# Patient Record
Sex: Female | Born: 1995 | Race: Black or African American | Hispanic: No | Marital: Single | Smoking: Current every day smoker
Health system: Southern US, Community
[De-identification: ages and names within clinical notes are randomized; demographics above are authoritative.]

## PROBLEM LIST (undated history)

## (undated) DIAGNOSIS — F329 Major depressive disorder, single episode, unspecified: Secondary | ICD-10-CM

## (undated) DIAGNOSIS — J45909 Unspecified asthma, uncomplicated: Secondary | ICD-10-CM

## (undated) DIAGNOSIS — F909 Attention-deficit hyperactivity disorder, unspecified type: Secondary | ICD-10-CM

## (undated) DIAGNOSIS — F319 Bipolar disorder, unspecified: Secondary | ICD-10-CM

## (undated) DIAGNOSIS — F419 Anxiety disorder, unspecified: Secondary | ICD-10-CM

## (undated) DIAGNOSIS — F32A Depression, unspecified: Secondary | ICD-10-CM

## (undated) DIAGNOSIS — N611 Abscess of the breast and nipple: Secondary | ICD-10-CM

## (undated) DIAGNOSIS — T50901A Poisoning by unspecified drugs, medicaments and biological substances, accidental (unintentional), initial encounter: Secondary | ICD-10-CM

## (undated) HISTORY — DX: Abscess of the breast and nipple: N61.1

## (undated) HISTORY — DX: Anxiety disorder, unspecified: F41.9

## (undated) HISTORY — DX: Attention-deficit hyperactivity disorder, unspecified type: F90.9

## (undated) HISTORY — PX: INCISION AND DRAINAGE BREAST ABSCESS: SUR672

---
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
## 2014-07-21 ENCOUNTER — Emergency Department: Payer: Self-pay | Admitting: Emergency Medicine

## 2015-01-19 ENCOUNTER — Emergency Department
Admission: EM | Admit: 2015-01-19 | Discharge: 2015-01-19 | Discharge status: Against Medical Advice | Payer: BLUE CROSS/BLUE SHIELD | Attending: Emergency Medicine | Admitting: Emergency Medicine

## 2015-01-19 ENCOUNTER — Other Ambulatory Visit: Payer: Self-pay

## 2015-01-19 DIAGNOSIS — R109 Unspecified abdominal pain: Secondary | ICD-10-CM | POA: Diagnosis not present

## 2015-01-19 DIAGNOSIS — R079 Chest pain, unspecified: Secondary | ICD-10-CM | POA: Insufficient documentation

## 2015-01-19 DIAGNOSIS — R51 Headache: Secondary | ICD-10-CM | POA: Insufficient documentation

## 2015-01-19 LAB — CBC
HCT: 40.1 % (ref 35.0–47.0)
Hemoglobin: 14 g/dL (ref 12.0–16.0)
MCH: 34.9 pg — AB (ref 26.0–34.0)
MCHC: 34.9 g/dL (ref 32.0–36.0)
MCV: 100.2 fL — AB (ref 80.0–100.0)
PLATELETS: 249 10*3/uL (ref 150–440)
RBC: 4 MIL/uL (ref 3.80–5.20)
RDW: 14.7 % — AB (ref 11.5–14.5)
WBC: 10 10*3/uL (ref 3.6–11.0)

## 2015-01-19 LAB — URINALYSIS COMPLETE WITH MICROSCOPIC (ARMC ONLY)
Bacteria, UA: NONE SEEN
Bilirubin Urine: NEGATIVE
GLUCOSE, UA: NEGATIVE mg/dL
Hgb urine dipstick: NEGATIVE
Ketones, ur: NEGATIVE mg/dL
LEUKOCYTES UA: NEGATIVE
NITRITE: NEGATIVE
PH: 6 (ref 5.0–8.0)
PROTEIN: NEGATIVE mg/dL
Specific Gravity, Urine: 1.004 — ABNORMAL LOW (ref 1.005–1.030)

## 2015-01-19 LAB — COMPREHENSIVE METABOLIC PANEL
ALBUMIN: 4.2 g/dL (ref 3.5–5.0)
ALK PHOS: 45 U/L (ref 38–126)
ALT: 19 U/L (ref 14–54)
AST: 18 U/L (ref 15–41)
Anion gap: 10 (ref 5–15)
BUN: 6 mg/dL (ref 6–20)
CALCIUM: 9 mg/dL (ref 8.9–10.3)
CO2: 23 mmol/L (ref 22–32)
CREATININE: 0.51 mg/dL (ref 0.44–1.00)
Chloride: 108 mmol/L (ref 101–111)
GFR calc Af Amer: >60 mL/min (ref >60)
GLUCOSE: 82 mg/dL (ref 65–99)
Potassium: 3.3 mmol/L — ABNORMAL LOW (ref 3.5–5.1)
Sodium: 141 mmol/L (ref 135–145)
Total Bilirubin: 0.3 mg/dL (ref 0.3–1.2)
Total Protein: 7.7 g/dL (ref 6.5–8.1)

## 2015-01-19 LAB — POCT PREGNANCY, URINE: PREG TEST UR: NEGATIVE

## 2015-01-19 LAB — LIPASE, BLOOD: Lipase: 21 U/L — ABNORMAL LOW (ref 22–51)

## 2015-01-19 LAB — GLUCOSE, CAPILLARY: GLUCOSE-CAPILLARY: 73 mg/dL (ref 65–99)

## 2015-01-19 NOTE — ED Notes (Signed)
Patient states "headache, chest pain, stomach pain and my kidneys hurt."

## 2015-04-12 ENCOUNTER — Emergency Department
Admission: EM | Admit: 2015-04-12 | Discharge: 2015-04-12 | Disposition: A | Discharge status: Home / Self Care | Payer: BLUE CROSS/BLUE SHIELD | Attending: Emergency Medicine | Admitting: Emergency Medicine

## 2015-04-12 ENCOUNTER — Encounter: Payer: Self-pay | Admitting: Emergency Medicine

## 2015-04-12 DIAGNOSIS — Z720 Tobacco use: Secondary | ICD-10-CM | POA: Diagnosis not present

## 2015-04-12 DIAGNOSIS — Y998 Other external cause status: Secondary | ICD-10-CM | POA: Diagnosis not present

## 2015-04-12 DIAGNOSIS — W260XXA Contact with knife, initial encounter: Secondary | ICD-10-CM | POA: Insufficient documentation

## 2015-04-12 DIAGNOSIS — Y92000 Kitchen of unspecified non-institutional (private) residence as  the place of occurrence of the external cause: Secondary | ICD-10-CM | POA: Insufficient documentation

## 2015-04-12 DIAGNOSIS — Y93G3 Activity, cooking and baking: Secondary | ICD-10-CM | POA: Diagnosis not present

## 2015-04-12 DIAGNOSIS — S61210A Laceration without foreign body of right index finger without damage to nail, initial encounter: Secondary | ICD-10-CM | POA: Insufficient documentation

## 2015-04-12 MED ORDER — LIDOCAINE HCL (PF) 1 % IJ SOLN
2.0000 mL | Freq: Once | INTRAMUSCULAR | Status: AC
  Administered 2015-04-12: 2 mL
  Filled 2015-04-12: qty 5

## 2015-04-12 NOTE — ED Notes (Signed)
Pt comes into the ED via POV c/o right index finger laceration.  Patient cut the finger while using a knife.  No active bleeding at this moment and finger has full movement.

## 2015-04-12 NOTE — ED Provider Notes (Signed)
CSN: [id number]     Arrival date & time 04/12/15  1927 History   First MD Initiated Contact with Patient 04/12/15 2001     Chief Complaint  Patient presents with  . Laceration     (Consider location/radiation/quality/duration/timing/severity/associated sxs/prior Treatment) HPI  19 year old female presents to emergency department for evaluation of laceration to her right index finger. Patient states she was cooking in the kitchen when she cut her index finger with a knife. Patient has a small laceration to the radial aspect of right index finger on the proximal phalanx. There is no numbness tingling or limited range of motion. Her tetanus is up-to-date. Pain is mild. She's not had any medications for pain. She did clean the laceration with alcohol.  History reviewed. No pertinent past medical history. History reviewed. No pertinent past surgical history. No family history on file. Social History  Substance Use Topics  . Smoking status: Current Every Day Smoker -- 0.25 packs/day  . Smokeless tobacco: None  . Alcohol Use: Yes   OB History    No data available     Review of Systems  Constitutional: Negative for fever, chills, activity change and fatigue.  HENT: Negative for congestion, sinus pressure and sore throat.   Eyes: Negative for visual disturbance.  Respiratory: Negative for cough, chest tightness and shortness of breath.   Cardiovascular: Negative for chest pain and leg swelling.  Gastrointestinal: Negative for nausea, vomiting, abdominal pain and diarrhea.  Genitourinary: Negative for dysuria.  Musculoskeletal: Negative for arthralgias and gait problem.  Skin: Positive for wound. Negative for rash.  Neurological: Negative for weakness, numbness and headaches.  Hematological: Negative for adenopathy.  Psychiatric/Behavioral: Negative for behavioral problems, confusion and agitation.      Allergies  Review of patient's allergies indicates no known allergies.  Home  Medications   Prior to Admission medications   Not on File   BP 129/84 mmHg  Pulse 70  Temp(Src) 98.2 F (36.8 C) (Oral)  Resp 15  Ht 5\' 5"  (1.651 m)  Wt 175 lb (79.379 kg)  BMI 29.12 kg/m2  SpO2 100%  LMP 04/08/2015 Physical Exam  Constitutional: She is oriented to person, place, and time. She appears well-developed and well-nourished. No distress.  HENT:  Head: Normocephalic and atraumatic.  Mouth/Throat: Oropharynx is clear and moist.  Eyes: EOM are normal. Pupils are equal, round, and reactive to light. Right eye exhibits no discharge. Left eye exhibits no discharge.  Neck: Normal range of motion. Neck supple.  Cardiovascular: Normal rate and intact distal pulses.   Pulmonary/Chest: Effort normal. No respiratory distress.  Abdominal: Soft.  Musculoskeletal:  Condition the right hand shows the patient has full composite fist grip strength 5 out of 5. There is a small to severe laceration that is linear with no evidence of foreign body along the radial aspect of the right index finger along the proximal phalanx. No tendon deficits noted. Sensation staff throughout. 2+ Refill.  Neurological: She is alert and oriented to person, place, and time. She has normal reflexes.  Skin: Skin is warm and dry.  Psychiatric: She has a normal mood and affect. Her behavior is normal. Thought content normal.    ED Course  Procedures (including critical care time) LACERATION REPAIR Performed by: Patience MuscaGAINES, THOMAS CHRISTOPHER Authorized by: Patience MuscaGAINES, THOMAS CHRISTOPHER Consent: Verbal consent obtained. Risks and benefits: risks, benefits and alternatives were discussed Consent given by: patient Patient identity confirmed: provided demographic data Prepped and Draped in normal sterile fashion Wound explored  Laceration Location:  Right index finger  Laceration Length: 2 cm  No Foreign Bodies seen or palpated  Anesthesia: local infiltration  Local anesthetic: lidocaine 1% % without  epinephrine  Anesthetic total: 1 ml  Irrigation method: syringe Amount of cleaning: standard  Skin closure: 2   Number of sutures: 5-0 nylon   Technique: Simple interrupted   Patient tolerance: Patient tolerated the procedure well with no immediate complications.  Labs Review Labs Reviewed - No data to display  Imaging Review No results found. I have personally reviewed and evaluated these images and lab results as part of my medical decision-making.   EKG Interpretation None      MDM   Final diagnoses:  Laceration of right index finger w/o foreign body w/o damage to nail, initial encounter   19 year old female with laceration to the right index finger. Laceration is simple with no tendon or neurological deficits noted. Laceration repair with 2 5-0 nylon sutures. Patient will return in 8 days for suture removal. Keep the incision site clean and dry. Return for any redness warmth erythema or drainage      Evon Slack, PA-C 04/12/15 2013  Emily Filbert, MD 04/12/15 2250

## 2015-04-12 NOTE — Discharge Instructions (Signed)
Laceration Care, Adult  A laceration is a cut that goes through all layers of the skin. The cut also goes into the tissue that is right under the skin. Some cuts heal on their own. Others need to be closed with stitches (sutures), staples, skin adhesive strips, or wound glue. Taking care of your cut lowers your risk of infection and helps your cut to heal better.  HOW TO TAKE CARE OF YOUR CUT  For stitches or staples:  · Keep the wound clean and dry.  · If you were given a bandage (dressing), you should change it at least one time per day or as told by your doctor. You should also change it if it gets wet or dirty.  · Keep the wound completely dry for the first 24 hours or as told by your doctor. After that time, you may take a shower or a bath. However, make sure that the wound is not soaked in water until after the stitches or staples have been removed.  · Clean the wound one time each day or as told by your doctor:    Wash the wound with soap and water.    Rinse the wound with water until all of the soap comes off.    Pat the wound dry with a clean towel. Do not rub the wound.  · After you clean the wound, put a thin layer of antibiotic ointment on it as told by your doctor. This ointment:    Helps to prevent infection.    Keeps the bandage from sticking to the wound.  · Have your stitches or staples removed as told by your doctor.  If your doctor used skin adhesive strips:   · Keep the wound clean and dry.  · If you were given a bandage, you should change it at least one time per day or as told by your doctor. You should also change it if it gets dirty or wet.  · Do not get the skin adhesive strips wet. You can take a shower or a bath, but be careful to keep the wound dry.  · If the wound gets wet, pat it dry with a clean towel. Do not rub the wound.  · Skin adhesive strips fall off on their own. You can trim the strips as the wound heals. Do not remove any strips that are still stuck to the wound. They will  fall off after a while.  If your doctor used wound glue:  · Try to keep your wound dry, but you may briefly wet it in the shower or bath. Do not soak the wound in water, such as by swimming.  · After you take a shower or a bath, gently pat the wound dry with a clean towel. Do not rub the wound.  · Do not do any activities that will make you really sweaty until the skin glue has fallen off on its own.  · Do not apply liquid, cream, or ointment medicine to your wound while the skin glue is still on.  · If you were given a bandage, you should change it at least one time per day or as told by your doctor. You should also change it if it gets dirty or wet.  · If a bandage is placed over the wound, do not let the tape for the bandage touch the skin glue.  · Do not pick at the glue. The skin glue usually stays on for 5-10 days. Then, it   falls off of the skin.  General Instructions   · To help prevent scarring, make sure to cover your wound with sunscreen whenever you are outside after stitches are removed, after adhesive strips are removed, or when wound glue stays in place and the wound is healed. Make sure to wear a sunscreen of at least 30 SPF.  · Take over-the-counter and prescription medicines only as told by your doctor.  · If you were given antibiotic medicine or ointment, take or apply it as told by your doctor. Do not stop using the antibiotic even if your wound is getting better.  · Do not scratch or pick at the wound.  · Keep all follow-up visits as told by your doctor. This is important.  · Check your wound every day for signs of infection. Watch for:    Redness, swelling, or pain.    Fluid, blood, or pus.  · Raise (elevate) the injured area above the level of your heart while you are sitting or lying down, if possible.  GET HELP IF:  · You got a tetanus shot and you have any of these problems at the injection site:    Swelling.    Very bad pain.    Redness.    Bleeding.  · You have a fever.  · A wound that was  closed breaks open.  · You notice a bad smell coming from your wound or your bandage.  · You notice something coming out of the wound, such as wood or glass.  · Medicine does not help your pain.  · You have more redness, swelling, or pain at the site of your wound.  · You have fluid, blood, or pus coming from your wound.  · You notice a change in the color of your skin near your wound.  · You need to change the bandage often because fluid, blood, or pus is coming from the wound.  · You start to have a new rash.  · You start to have numbness around the wound.  GET HELP RIGHT AWAY IF:  · You have very bad swelling around the wound.  · Your pain suddenly gets worse and is very bad.  · You notice painful lumps near the wound or on skin that is anywhere on your body.  · You have a red streak going away from your wound.  · The wound is on your hand or foot and you cannot move a finger or toe like you usually can.  · The wound is on your hand or foot and you notice that your fingers or toes look pale or bluish.     This information is not intended to replace advice given to you by your health care provider. Make sure you discuss any questions you have with your health care provider.     Document Released: 11/18/2007 Document Revised: 10/16/2014 Document Reviewed: 05/28/2014  Elsevier Interactive Patient Education ©2016 [employer]

## 2015-09-22 ENCOUNTER — Emergency Department
Admission: EM | Admit: 2015-09-22 | Discharge: 2015-09-22 | Disposition: A | Discharge status: Home / Self Care | Payer: BLUE CROSS/BLUE SHIELD | Attending: Emergency Medicine | Admitting: Emergency Medicine

## 2015-09-22 ENCOUNTER — Emergency Department: Payer: BLUE CROSS/BLUE SHIELD

## 2015-09-22 ENCOUNTER — Encounter: Payer: Self-pay | Admitting: *Deleted

## 2015-09-22 DIAGNOSIS — F17200 Nicotine dependence, unspecified, uncomplicated: Secondary | ICD-10-CM | POA: Insufficient documentation

## 2015-09-22 DIAGNOSIS — R42 Dizziness and giddiness: Secondary | ICD-10-CM

## 2015-09-22 DIAGNOSIS — F1919 Other psychoactive substance abuse with unspecified psychoactive substance-induced disorder: Secondary | ICD-10-CM | POA: Diagnosis not present

## 2015-09-22 DIAGNOSIS — F1910 Other psychoactive substance abuse, uncomplicated: Secondary | ICD-10-CM

## 2015-09-22 DIAGNOSIS — J45909 Unspecified asthma, uncomplicated: Secondary | ICD-10-CM | POA: Insufficient documentation

## 2015-09-22 HISTORY — DX: Unspecified asthma, uncomplicated: J45.909

## 2015-09-22 LAB — CBC
HCT: 42 % (ref 35.0–47.0)
Hemoglobin: 14.5 g/dL (ref 12.0–16.0)
MCH: 33.5 pg (ref 26.0–34.0)
MCHC: 34.6 g/dL (ref 32.0–36.0)
MCV: 96.6 fL (ref 80.0–100.0)
PLATELETS: 359 10*3/uL (ref 150–440)
RBC: 4.34 MIL/uL (ref 3.80–5.20)
RDW: 13.4 % (ref 11.5–14.5)
WBC: 8.2 10*3/uL (ref 3.6–11.0)

## 2015-09-22 LAB — URINALYSIS COMPLETE WITH MICROSCOPIC (ARMC ONLY)
BILIRUBIN URINE: NEGATIVE
Glucose, UA: NEGATIVE mg/dL
HGB URINE DIPSTICK: NEGATIVE
NITRITE: NEGATIVE
PH: 6 (ref 5.0–8.0)
Protein, ur: 30 mg/dL — AB
SPECIFIC GRAVITY, URINE: 1.02 (ref 1.005–1.030)

## 2015-09-22 LAB — BASIC METABOLIC PANEL
ANION GAP: 8 (ref 5–15)
BUN: 5 mg/dL — ABNORMAL LOW (ref 6–20)
CO2: 20 mmol/L — AB (ref 22–32)
Calcium: 8.9 mg/dL (ref 8.9–10.3)
Chloride: 108 mmol/L (ref 101–111)
Creatinine, Ser: 0.54 mg/dL (ref 0.44–1.00)
GFR calc non Af Amer: >60 mL/min (ref >60)
Glucose, Bld: 90 mg/dL (ref 65–99)
POTASSIUM: 3.3 mmol/L — AB (ref 3.5–5.1)
Sodium: 136 mmol/L (ref 135–145)

## 2015-09-22 LAB — POCT PREGNANCY, URINE: Preg Test, Ur: NEGATIVE

## 2015-09-22 LAB — HEPATIC FUNCTION PANEL
ALBUMIN: 4.4 g/dL (ref 3.5–5.0)
ALK PHOS: 48 U/L (ref 38–126)
ALT: 17 U/L (ref 14–54)
AST: 20 U/L (ref 15–41)
BILIRUBIN TOTAL: 0.6 mg/dL (ref 0.3–1.2)
Bilirubin, Direct: 0.1 mg/dL (ref 0.1–0.5)
Indirect Bilirubin: 0.5 mg/dL (ref 0.3–0.9)
Total Protein: 8 g/dL (ref 6.5–8.1)

## 2015-09-22 LAB — URINE DRUG SCREEN, QUALITATIVE (ARMC ONLY)
Amphetamines, Ur Screen: NOT DETECTED
BARBITURATES, UR SCREEN: NOT DETECTED
Benzodiazepine, Ur Scrn: NOT DETECTED
CANNABINOID 50 NG, UR ~~LOC~~: POSITIVE — AB
COCAINE METABOLITE, UR ~~LOC~~: POSITIVE — AB
MDMA (Ecstasy)Ur Screen: NOT DETECTED
Methadone Scn, Ur: NOT DETECTED
OPIATE, UR SCREEN: NOT DETECTED
PHENCYCLIDINE (PCP) UR S: NOT DETECTED
Tricyclic, Ur Screen: NOT DETECTED

## 2015-09-22 LAB — ETHANOL: Alcohol, Ethyl (B): 104 mg/dL — ABNORMAL HIGH (ref <5)

## 2015-09-22 NOTE — ED Notes (Signed)
MD did not suggest testing for influenza

## 2015-09-22 NOTE — ED Provider Notes (Signed)
[facility] Emergency Department Provider Note    ____________________________________________  Time seen: ~1150  I have reviewed the triage vital signs and the nursing notes.   HISTORY  Chief Complaint Dizziness and Generalized Body Aches   History limited by: Not Limited   HPI Jeanette Zimmerman is a 20 y.o. female who presents to the emergency department today because of concerns for1 month of feeling unwell. She states she has had body aches. She states these are everywhere. They do become severe. She has also had a cough. She has also had chills however has not measured any fevers. The patient does state that she drinks up to 8 glasses of alcohol a day. In addition she does use marijuana, Molly and cocaine. Furthermore she smokes.   Past Medical History  Diagnosis Date  . Asthma     There are no active problems to display for this patient.   No past surgical history on file.  No current outpatient prescriptions on file.  Allergies Review of patient's allergies indicates no known allergies.  No family history on file.  Social History Social History  Substance Use Topics  . Smoking status: Current Every Day Smoker -- 0.25 packs/day  . Smokeless tobacco: Not on file  . Alcohol Use: Yes    Review of Systems  Constitutional: Negative for fever. Cardiovascular: Negative for chest pain. Respiratory: Negative for shortness of breath. Positive for cough Gastrointestinal: Negative for abdominal pain, vomiting and diarrhea. Neurological: Negative for headaches, focal weakness or numbness.  10-point ROS otherwise negative.  ____________________________________________   PHYSICAL EXAM:  VITAL SIGNS: ED Triage Vitals  Enc Vitals Group     BP 09/22/15 1053 143/101 mmHg     Pulse Rate 09/22/15 1053 113     Resp 09/22/15 1053 18     Temp 09/22/15 1053 98.6 F (37 C)     Temp Source 09/22/15 1053 Oral     SpO2 09/22/15 1053 99 %   Weight 09/22/15 1053 170 lb (77.111 kg)     Height 09/22/15 1053  (1.651 m)     Head Cir --      Peak Flow --      Pain Score 09/22/15 1058 10   Constitutional: Alert and oriented. Well appearing and in no distress. Eyes: Conjunctivae are normal. PERRL. Normal extraocular movements. ENT   Head: Normocephalic and atraumatic.   Nose: No congestion/rhinnorhea.   Mouth/Throat: Mucous membranes are moist.   Neck: No stridor. Hematological/Lymphatic/Immunilogical: No cervical lymphadenopathy. Cardiovascular: Normal rate, regular rhythm.  No murmurs, rubs, or gallops. Respiratory: Normal respiratory effort without tachypnea nor retractions. Breath sounds are clear and equal bilaterally. No wheezes/rales/rhonchi. Gastrointestinal: Soft and nontender. No distention.  Genitourinary: Deferred Musculoskeletal: Normal range of motion in all extremities. No joint effusions.  No lower extremity tenderness nor edema. Neurologic:  Normal speech and language. No gross focal neurologic deficits are appreciated.  Skin:  Skin is warm, dry and intact. No rash noted. Psychiatric: Mood and affect are normal. Speech and behavior are normal. Patient exhibits appropriate insight and judgment.  ____________________________________________    LABS (pertinent positives/negatives)  Labs Reviewed  BASIC METABOLIC PANEL - Abnormal; Notable for the following:    Potassium 3.3 (*)    CO2 20 (*)    BUN 5 (*)    All other components within normal limits  URINALYSIS COMPLETEWITH MICROSCOPIC ([facility] ONLY) - Abnormal; Notable for the following:    Color, Urine YELLOW (*)    APPearance CLOUDY (*)  Ketones, ur 1+ (*)    Protein, ur 30 (*)    Leukocytes, UA 3+ (*)    Bacteria, UA RARE (*)    Squamous Epithelial / LPF 6-30 (*)    All other components within normal limits  URINE DRUG SCREEN, QUALITATIVE ([facility] ONLY) - Abnormal; Notable for the following:    Cocaine Metabolite,Ur [place] POSITIVE (*)     Cannabinoid 50 Ng, Ur [place] POSITIVE (*)    All other components within normal limits  CBC  ETHANOL  POCT PREGNANCY, URINE  CBG MONITORING, ED     ____________________________________________   EKG  I, Phineas SemenGraydon Grae Cannata, attending physician, personally viewed and interpreted this EKG  EKG Time: 1106 Rate: 92 Rhythm: normal sinus rhythm Axis: normal Intervals: qtc 445 QRS: narrow ST changes: no st elevation Impression: normal ekg ____________________________________________    RADIOLOGY  CXR  IMPRESSION: No active cardiopulmonary disease.  ____________________________________________   PROCEDURES  Procedure(s) performed: None  Critical Care performed: No  ____________________________________________   INITIAL IMPRESSION / ASSESSMENT AND PLAN / ED COURSE  Pertinent labs & imaging results that were available during my care of the patient were reviewed by me and considered in my medical decision making (see chart for details).  Patient presented to the emergency department today with complaints of full body aches, dizziness for the past month. Patient does have a history of polysubstance abuse including Molly cocaine alcohol and marijuana. Patient denies any IV drug use. Blood work without concerning findings today. Patient's urine did have some white blood cells of her multiple squamous cells. I will send this off for culture. I think patient would likely benefit from polysubstance abuse counseling and help. Will give [facility] for follow-up. Will also give patient primary care follow-up options.  ____________________________________________   FINAL CLINICAL IMPRESSION(S) / ED DIAGNOSES  Final diagnoses:  Polysubstance abuse  Dizziness     Phineas SemenGraydon Jeanette Deboy, MD 09/22/15 1344

## 2015-09-22 NOTE — ED Notes (Signed)
Pt admits to using Kirt BoysMolly a couple of days ago. Has not used cocaine for a few weeks. Pt states " I am more concerned about the alcohol use"; visitors state she has been using alcohol to help with her lower abd pain.

## 2015-09-22 NOTE — Discharge Instructions (Signed)
Please seek medical attention for any high fevers, chest pain, shortness of breath, change in behavior, persistent vomiting, bloody stool or any other new or concerning symptoms. ° ° °Polysubstance Abuse °When people abuse more than one drug or type of drug it is called polysubstance or polydrug abuse. For example, many smokers also drink alcohol. This is one form of polydrug abuse. Polydrug abuse also refers to the use of a drug to counteract an unpleasant effect produced by another drug. It may also be used to help with withdrawal from another drug. People who take stimulants may become agitated. Sometimes this agitation is countered with a tranquilizer. This helps protect against the unpleasant side effects. Polydrug abuse also refers to the use of different drugs at the same time.  °Anytime drug use is interfering with normal living activities, it has become abuse. This includes problems with family and friends. Psychological dependence has developed when your mind tells you that the drug is needed. This is usually followed by physical dependence which has developed when continuing increases of drug are required to get the same feeling or "high". This is known as addiction or chemical dependency. A person's risk is much higher if there is a history of chemical dependency in the family. °SIGNS OF CHEMICAL DEPENDENCY °· You have been told by friends or family that drugs have become a problem. °· You fight when using drugs. °· You are having blackouts (not remembering what you do while using). °· You feel sick from using drugs but continue using. °· You lie about use or amounts of drugs (chemicals) used. °· You need chemicals to get you going. °· You are suffering in work performance or in school because of drug use. °· You get sick from use of drugs but continue to use anyway. °· You need drugs to relate to people or feel comfortable in social situations. °· You use drugs to forget problems. °"Yes" answered to any  of the above signs of chemical dependency indicates there are problems. The longer the use of drugs continues, the greater the problems will become. °If there is a family history of drug or alcohol use, it is best not to experiment with these drugs. Continual use leads to tolerance. After tolerance develops more of the drug is needed to get the same feeling. This is followed by addiction. With addiction, drugs become the most important part of life. It becomes more important to take drugs than participate in the other usual activities of life. This includes relating to friends and family. Addiction is followed by dependency. Dependency is a condition where drugs are now needed not just to get high, but to feel normal. °Addiction cannot be cured but it can be stopped. This often requires outside help and the care of professionals. Treatment centers are listed in the yellow pages under: Cocaine, Narcotics, and Alcoholics Anonymous. Most hospitals and clinics can refer you to a specialized care center. Talk to your caregiver if you need help. °  °This information is not intended to replace advice given to you by your health care provider. Make sure you discuss any questions you have with your health care provider. °  °Document Released: 01/21/2005 Document Revised: 08/24/2011 Document Reviewed: 06/06/2014 °Elsevier Interactive Patient Education ©2016 [employer] ° °

## 2015-09-22 NOTE — ED Notes (Addendum)
States body aches, dizziness, fever, lack of appetite, and dizziness for about 1 month getting worse over time, states dark urine, pt awake and alert, states she has been using molly, cocaine and ETOH, states she drinks about 10 beers a day and smokes marijuina everyday, denies any SI or HI, states they may be interested in detox help once medicially cleared

## 2015-09-24 LAB — URINE CULTURE

## 2015-10-31 ENCOUNTER — Emergency Department
Admission: EM | Admit: 2015-10-31 | Discharge: 2015-10-31 | Disposition: A | Discharge status: Home / Self Care | Payer: BLUE CROSS/BLUE SHIELD | Attending: Emergency Medicine | Admitting: Emergency Medicine

## 2015-10-31 ENCOUNTER — Emergency Department: Payer: BLUE CROSS/BLUE SHIELD

## 2015-10-31 DIAGNOSIS — R45851 Suicidal ideations: Secondary | ICD-10-CM

## 2015-10-31 DIAGNOSIS — F1994 Other psychoactive substance use, unspecified with psychoactive substance-induced mood disorder: Secondary | ICD-10-CM | POA: Diagnosis not present

## 2015-10-31 DIAGNOSIS — F10120 Alcohol abuse with intoxication, uncomplicated: Secondary | ICD-10-CM | POA: Diagnosis not present

## 2015-10-31 DIAGNOSIS — J45909 Unspecified asthma, uncomplicated: Secondary | ICD-10-CM | POA: Diagnosis not present

## 2015-10-31 DIAGNOSIS — W228XXA Striking against or struck by other objects, initial encounter: Secondary | ICD-10-CM | POA: Diagnosis not present

## 2015-10-31 DIAGNOSIS — Y929 Unspecified place or not applicable: Secondary | ICD-10-CM | POA: Diagnosis not present

## 2015-10-31 DIAGNOSIS — F1020 Alcohol dependence, uncomplicated: Secondary | ICD-10-CM

## 2015-10-31 DIAGNOSIS — S62396A Other fracture of fifth metacarpal bone, right hand, initial encounter for closed fracture: Secondary | ICD-10-CM | POA: Insufficient documentation

## 2015-10-31 DIAGNOSIS — Y999 Unspecified external cause status: Secondary | ICD-10-CM | POA: Insufficient documentation

## 2015-10-31 DIAGNOSIS — M7989 Other specified soft tissue disorders: Secondary | ICD-10-CM

## 2015-10-31 DIAGNOSIS — F17210 Nicotine dependence, cigarettes, uncomplicated: Secondary | ICD-10-CM | POA: Diagnosis not present

## 2015-10-31 DIAGNOSIS — Y939 Activity, unspecified: Secondary | ICD-10-CM | POA: Insufficient documentation

## 2015-10-31 DIAGNOSIS — F1290 Cannabis use, unspecified, uncomplicated: Secondary | ICD-10-CM | POA: Insufficient documentation

## 2015-10-31 DIAGNOSIS — F341 Dysthymic disorder: Secondary | ICD-10-CM

## 2015-10-31 DIAGNOSIS — S62339A Displaced fracture of neck of unspecified metacarpal bone, initial encounter for closed fracture: Secondary | ICD-10-CM

## 2015-10-31 DIAGNOSIS — F1420 Cocaine dependence, uncomplicated: Secondary | ICD-10-CM

## 2015-10-31 DIAGNOSIS — F10920 Alcohol use, unspecified with intoxication, uncomplicated: Secondary | ICD-10-CM

## 2015-10-31 DIAGNOSIS — S6991XA Unspecified injury of right wrist, hand and finger(s), initial encounter: Secondary | ICD-10-CM | POA: Diagnosis present

## 2015-10-31 LAB — COMPREHENSIVE METABOLIC PANEL
ALT: 16 U/L (ref 14–54)
AST: 20 U/L (ref 15–41)
Albumin: 4.8 g/dL (ref 3.5–5.0)
Alkaline Phosphatase: 51 U/L (ref 38–126)
Anion gap: 10 (ref 5–15)
BILIRUBIN TOTAL: 0.6 mg/dL (ref 0.3–1.2)
BUN: 5 mg/dL — ABNORMAL LOW (ref 6–20)
CALCIUM: 9.2 mg/dL (ref 8.9–10.3)
CHLORIDE: 107 mmol/L (ref 101–111)
CO2: 20 mmol/L — ABNORMAL LOW (ref 22–32)
CREATININE: 0.62 mg/dL (ref 0.44–1.00)
Glucose, Bld: 86 mg/dL (ref 65–99)
Potassium: 3.4 mmol/L — ABNORMAL LOW (ref 3.5–5.1)
Sodium: 137 mmol/L (ref 135–145)
TOTAL PROTEIN: 8.7 g/dL — AB (ref 6.5–8.1)

## 2015-10-31 LAB — URINE DRUG SCREEN, QUALITATIVE (ARMC ONLY)
Amphetamines, Ur Screen: NOT DETECTED
BARBITURATES, UR SCREEN: NOT DETECTED
BENZODIAZEPINE, UR SCRN: NOT DETECTED
CANNABINOID 50 NG, UR ~~LOC~~: POSITIVE — AB
Cocaine Metabolite,Ur ~~LOC~~: POSITIVE — AB
MDMA (Ecstasy)Ur Screen: NOT DETECTED
Methadone Scn, Ur: NOT DETECTED
Opiate, Ur Screen: NOT DETECTED
PHENCYCLIDINE (PCP) UR S: NOT DETECTED
TRICYCLIC, UR SCREEN: NOT DETECTED

## 2015-10-31 LAB — CBC
HCT: 41.6 % (ref 35.0–47.0)
Hemoglobin: 14.1 g/dL (ref 12.0–16.0)
MCH: 33.2 pg (ref 26.0–34.0)
MCHC: 34 g/dL (ref 32.0–36.0)
MCV: 97.6 fL (ref 80.0–100.0)
PLATELETS: 290 10*3/uL (ref 150–440)
RBC: 4.26 MIL/uL (ref 3.80–5.20)
RDW: 15 % — AB (ref 11.5–14.5)
WBC: 13 10*3/uL — ABNORMAL HIGH (ref 3.6–11.0)

## 2015-10-31 LAB — SALICYLATE LEVEL

## 2015-10-31 LAB — ACETAMINOPHEN LEVEL: Acetaminophen (Tylenol), Serum: <10 ug/mL — ABNORMAL LOW (ref 10–30)

## 2015-10-31 LAB — ETHANOL: ALCOHOL ETHYL (B): 172 mg/dL — AB (ref <5)

## 2015-10-31 MED ORDER — LORAZEPAM 1 MG PO TABS
1.0000 mg | ORAL_TABLET | Freq: Once | ORAL | Status: AC
  Administered 2015-10-31: 1 mg via ORAL

## 2015-10-31 MED ORDER — DIPHENHYDRAMINE HCL 50 MG/ML IJ SOLN
INTRAMUSCULAR | Status: AC
  Filled 2015-10-31: qty 1

## 2015-10-31 MED ORDER — LORAZEPAM 1 MG PO TABS
ORAL_TABLET | ORAL | Status: AC
Start: 2015-10-31 — End: 2015-10-31
  Administered 2015-10-31: 1 mg via ORAL
  Filled 2015-10-31: qty 1

## 2015-10-31 MED ORDER — LORAZEPAM 2 MG/ML IJ SOLN
INTRAMUSCULAR | Status: AC
  Filled 2015-10-31: qty 1

## 2015-10-31 MED ORDER — HALOPERIDOL LACTATE 5 MG/ML IJ SOLN
INTRAMUSCULAR | Status: AC
  Filled 2015-10-31: qty 1

## 2015-10-31 NOTE — ED Notes (Signed)
Pt given clothes and is changing with visitor in the room. Visitor will be taking the pt home.

## 2015-10-31 NOTE — ED Provider Notes (Signed)
[facility] Emergency Department Provider Note  ____________________________________________  Time seen: 5:35 AM  I have reviewed the triage vital signs and the nursing notes.   HISTORY  Chief Complaint Suicidal      HPI Jeanette Zimmerman is a 20 y.o. female presents with suicidal ideation times a few months as well as requesting alcohol detox. Patient states that she drinks heavily daily and has been doing so since the age of 41. Patient admits to drinking "a lot of alcohol today, I'm drunk as...." Patient also requested that I evaluate her right hand as she punched a floor approximately one week ago and has had pain in the hand with swelling and bruising since that time. Current pain score is described as mild.     Past Medical History  Diagnosis Date  . Asthma     There are no active problems to display for this patient.  Past surgical history None No current outpatient prescriptions on file.  Allergies No known drug allergies No family history on file.  Social History Social History  Substance Use Topics  . Smoking status: Current Every Day Smoker -- 0.25 packs/day  . Smokeless tobacco: Not on file  . Alcohol Use: Yes    Review of Systems  Constitutional: Negative for fever. Eyes: Negative for visual changes. ENT: Negative for sore throat. Cardiovascular: Negative for chest pain. Respiratory: Negative for shortness of breath. Gastrointestinal: Negative for abdominal pain, vomiting and diarrhea. Genitourinary: Negative for dysuria. Musculoskeletal: Negative for back pain. Skin: Negative for rash. Neurological: Negative for headaches, focal weakness or numbness. Psychiatric:Positive for alcohol abuse and suicidal ideation  10-point ROS otherwise negative.  ____________________________________________   PHYSICAL EXAM:  VITAL SIGNS: ED Triage Vitals  Enc Vitals Group     BP 10/31/15 0529 138/84 mmHg     Pulse Rate 10/31/15  0529 118     Resp 10/31/15 0529 24     Temp 10/31/15 0529 98.3 F (36.8 C)     Temp src --      SpO2 10/31/15 0529 98 %     Weight 10/31/15 0529 130 lb (58.968 kg)     Height 10/31/15 0529  (1.651 m)     Head Cir --      Peak Flow --      Pain Score 10/31/15 0535 9     Pain Loc --      Pain Edu? --      Excl. in GC? --      Constitutional: Alert and oriented. Clinically intoxicated Eyes: Conjunctivae are normal. PERRL. Normal extraocular movements. ENT   Head: Normocephalic and atraumatic.   Nose: No congestion/rhinnorhea.   Mouth/Throat: Mucous membranes are moist.   Neck: No stridor. Hematological/Lymphatic/Immunilogical: No cervical lymphadenopathy. Cardiovascular: Normal rate, regular rhythm. Normal and symmetric distal pulses are present in all extremities. No murmurs, rubs, or gallops. Respiratory: Normal respiratory effort without tachypnea nor retractions. Breath sounds are clear and equal bilaterally. No wheezes/rales/rhonchi. Gastrointestinal: Soft and nontender. No distention. There is no CVA tenderness. Genitourinary: deferred Musculoskeletal: Nontender with normal range of motion in all extremities. No joint effusions. Swelling and ecchymoses noted right fifth metacarpal  Neurologic:  Normal speech and language. No gross focal neurologic deficits are appreciated. Speech is normal.  Skin:  Skin is warm, dry and intact. No rash noted. Psychiatric: Mood and affect are normal. Speech and behavior are normal. Patient exhibits appropriate insight and judgment.   INITIAL IMPRESSION / ASSESSMENT AND PLAN / ED COURSE  Pertinent labs & imaging results that were available during my care of the patient were reviewed by me and considered in my medical decision making (see chart for details).  Patient very irate punching the glass on the door to her room, verbally abusive to the staff. Spoke with the patient at length she received 1 mg of by mouth  Ativan.   ____________________________________________   FINAL CLINICAL IMPRESSION(S) / ED DIAGNOSES  Final diagnoses:  Swelling of right hand  Suicidal ideation  Alcohol intoxication, uncomplicated (HCC)      Darci Currentandolph N Lucus Lambertson, MD 10/31/15 [phone]

## 2015-10-31 NOTE — ED Notes (Signed)
Pt given a phone call   Assessment completed  She denies pain  Ulnar splint to be placed on right hand

## 2015-10-31 NOTE — ED Provider Notes (Addendum)
-----------------------------------------   10:22 AM on 10/31/2015 -----------------------------------------  Patient has a fifth metacarpal fracture with angulation on x-ray. I'll examine the patient myself she does have significant tenderness to this area with moderate swelling. Patient is able to make a fist with no appreciable angulation. We will place the patient in a ulnar gutter splint, continue to await psychiatric evaluation and we'll have the patient follow up with orthopedics in one week.  Minna AntisKevin Kevia Zaucha, MD 10/31/15 1023    ----------------------------------------- 12:51 PM on 10/31/2015 -----------------------------------------  Patient has been seen and evaluated by psychiatry they believe the patient states for discharge home with [facility] follow-up. We will discharge home with [facility] in orthopedics follow-up. Patient agreeable to plan.  Minna AntisKevin Dicy Smigel, MD 10/31/15 [phone]

## 2015-10-31 NOTE — Consult Note (Signed)
[facility]iat[facility] Consult   Reason for Consult:  Consult for 20 year old woman brought to the emergency room with reports of suicidal ideation when intoxicated Referring Physician:  Paduchowski Patient Identification: Jeanette Zimmerman MRN:  [id number] Principal Diagnosis: Substance induced mood disorder ([place]) Diagnosis:   Patient Active Problem List   Diagnosis Date Noted  . Substance induced mood disorder ([place]) [F19.94] 10/31/2015  . Alcohol abuse [F10.10] 10/31/2015  . Cocaine abuse [F14.10] 10/31/2015  . Suicidal ideation [R45.851] 10/31/2015  . Dysthymia [F34.1] 10/31/2015    Total Time spent with patient: 1 hour  Subjective:   Jeanette Zimmerman is a 20 y.o. female patient admitted with "I get angry when I drink".  HPI:  Patient interviewed. Chart reviewed. Labs and vitals reviewed. 20 year old woman brought to the emergency room intoxicated with a blood alcohol level almost 200 and drug screen positive for cocaine and marijuana. Patient had reportedly made statements about being suicidal. Patient states that when she is intoxicated she often does "crazy" things and gets very angry. She says that she thinks she remembers holding a kitchen knife last night but she did not cut herself and doesn't think she was having any thoughts of hurting anyone else. She says that she does have some chronic anxiety but most of it is related to situations in the past. She feels down and irritable much of the time but that is chronic. Doesn't report that she's been feeling any worse than usual with that. She denies having any thoughts at all about wanting to die or kill her self. Denies any thoughts about wanting to hurt anyone else. Not currently receiving any psychiatric treatment. She says she probably had about 6 or 7 beers plus some shots of rum and white liquor last night. She drinks about 2 times a week. She admits that when she drinks it changes her mood for the worse. She smokes  marijuana as well but denies any other drugs. Drug screen positive for cocaine.  Medical history: Patient had her right hand looked at because she had punched a concrete floor last week and was walking around with her hand in pain all week. They have wrapped it up and it sounds like they did diagnose an orthopedic injury. Otherwise no ongoing medical problems.  Social history: Patient lives with her father and her grandmother. She is not currently working but is looking for work. Got laid off her last job in March. She says she gets along okay with her father because he doesn't try to act like her friend. He does get on her about her drinking. She doesn't feel frightened of him however. She says she had a very bad relationship with her mother and doesn't stay in much contact with her and blames a lot of her stress in her life on problems with her mother. Patient has no children.  Substance abuse history: She reports that last year she was drinking much more heavily every day. Now only drinking a couple times a week. She has tried to stop on her own and has been able to stop for up to a month at a time. No history of seizures or delirium tremens. Never engaged in any kind of specific substance abuse treatment.  Past Psychiatric History: She saw a counselor last summer for anger problems. She can't remember where was but from her description it sounds like it might of been at [facility] or [place]. Never been on any medication for mental health problems. Never been in a  psychiatric hospital. Never actually tried to kill her self. Does not routinely self injure.  Risk to Self: Is patient at risk for suicide?: No Risk to Others:   Prior Inpatient Therapy:   Prior Outpatient Therapy:    Past Medical History:  Past Medical History  Diagnosis Date  . Asthma    No past surgical history on file. Family History: No family history on file. Family Psychiatric  History: She denies any family history at all of  mental health or substance abuse problems Social History:  History  Alcohol Use  . Yes     History  Drug Use  . Yes  . Special: Marijuana    Social History   Social History  . Marital Status: Single    Spouse Name: N/A  . Number of Children: N/A  . Years of Education: N/A   Social History Main Topics  . Smoking status: Current Every Day Smoker -- 0.25 packs/day  . Smokeless tobacco: Not on file  . Alcohol Use: Yes  . Drug Use: Yes    Special: Marijuana  . Sexual Activity: Not on file   Other Topics Concern  . Not on file   Social History Narrative   Additional Social History:    Allergies:  No Known Allergies  Labs:  Results for orders placed or performed during the hospital encounter of 10/31/15 (from the past 48 hour(s))  Comprehensive metabolic panel     Status: Abnormal   Collection Time: 10/31/15  6:10 AM  Result Value Ref Range   Sodium 137 135 - 145 mmol/L   Potassium 3.4 (L) 3.5 - 5.1 mmol/L   Chloride 107 101 - 111 mmol/L   CO2 20 (L) 22 - 32 mmol/L   Glucose, Bld 86 65 - 99 mg/dL   BUN 5 (L) 6 - 20 mg/dL   Creatinine, Ser 0.62 0.44 - 1.00 mg/dL   Calcium 9.2 8.9 - 10.3 mg/dL   Total Protein 8.7 (H) 6.5 - 8.1 g/dL   Albumin 4.8 3.5 - 5.0 g/dL   AST 20 15 - 41 U/L   ALT 16 14 - 54 U/L   Alkaline Phosphatase 51 38 - 126 U/L   Total Bilirubin 0.6 0.3 - 1.2 mg/dL   GFR calc non Af Amer >60 >60 mL/min   GFR calc Af Amer >60 >60 mL/min    Comment: (NOTE) The eGFR has been calculated using the CKD EPI equation. This calculation has not been validated in all clinical situations. eGFR's persistently <60 mL/min signify possible Chronic Kidney Disease.    Anion gap 10 5 - 15  Ethanol     Status: Abnormal   Collection Time: 10/31/15  6:10 AM  Result Value Ref Range   Alcohol, Ethyl (B) 172 (H) <5 mg/dL    Comment:        LOWEST DETECTABLE LIMIT FOR SERUM ALCOHOL IS 5 mg/dL FOR MEDICAL PURPOSES ONLY   Salicylate level     Status: None    Collection Time: 10/31/15  6:10 AM  Result Value Ref Range   Salicylate Lvl <6.2 2.8 - 30.0 mg/dL  Acetaminophen level     Status: Abnormal   Collection Time: 10/31/15  6:10 AM  Result Value Ref Range   Acetaminophen (Tylenol), Serum <10 (L) 10 - 30 ug/mL    Comment:        THERAPEUTIC CONCENTRATIONS VARY SIGNIFICANTLY. A RANGE OF 10-30 ug/mL MAY BE AN EFFECTIVE CONCENTRATION FOR MANY PATIENTS. HOWEVER, SOME ARE BEST TREATED AT  CONCENTRATIONS OUTSIDE THIS RANGE. ACETAMINOPHEN CONCENTRATIONS >150 ug/mL AT 4 HOURS AFTER INGESTION AND >50 ug/mL AT 12 HOURS AFTER INGESTION ARE OFTEN ASSOCIATED WITH TOXIC REACTIONS.   cbc     Status: Abnormal   Collection Time: 10/31/15  6:10 AM  Result Value Ref Range   WBC 13.0 (H) 3.6 - 11.0 K/uL   RBC 4.26 3.80 - 5.20 MIL/uL   Hemoglobin 14.1 12.0 - 16.0 g/dL   HCT 41.6 35.0 - 47.0 %   MCV 97.6 80.0 - 100.0 fL   MCH 33.2 26.0 - 34.0 pg   MCHC 34.0 32.0 - 36.0 g/dL   RDW 15.0 (H) 11.5 - 14.5 %   Platelets 290 150 - 440 K/uL  Urine Drug Screen, Qualitative     Status: Abnormal   Collection Time: 10/31/15 11:48 AM  Result Value Ref Range   Tricyclic, Ur Screen NONE DETECTED NONE DETECTED   Amphetamines, Ur Screen NONE DETECTED NONE DETECTED   MDMA (Ecstasy)Ur Screen NONE DETECTED NONE DETECTED   Cocaine Metabolite,Ur [place] POSITIVE (A) NONE DETECTED   Opiate, Ur Screen NONE DETECTED NONE DETECTED   Phencyclidine (PCP) Ur S NONE DETECTED NONE DETECTED   Cannabinoid 50 Ng, Ur [place] POSITIVE (A) NONE DETECTED   Barbiturates, Ur Screen NONE DETECTED NONE DETECTED   Benzodiazepine, Ur Scrn NONE DETECTED NONE DETECTED   Methadone Scn, Ur NONE DETECTED NONE DETECTED    Comment: (NOTE) 497  Tricyclics, urine               Cutoff 1000 ng/mL 200  Amphetamines, urine             Cutoff 1000 ng/mL 300  MDMA (Ecstasy), urine           Cutoff 500 ng/mL 400  Cocaine Metabolite, urine       Cutoff 300 ng/mL 500  Opiate, urine                   Cutoff 300  ng/mL 600  Phencyclidine (PCP), urine      Cutoff 25 ng/mL 700  Cannabinoid, urine              Cutoff 50 ng/mL 800  Barbiturates, urine             Cutoff 200 ng/mL 900  Benzodiazepine, urine           Cutoff 200 ng/mL 1000 Methadone, urine                Cutoff 300 ng/mL 1100 1200 The urine drug screen provides only a preliminary, unconfirmed 1300 analytical test result and should not be used for non-medical 1400 purposes. Clinical consideration and professional judgment should 1500 be applied to any positive drug screen result due to possible 1600 interfering substances. A more specific alternate chemical method 1700 must be used in order to obtain a confirmed analytical result.  1800 Gas chromato graphy / mass spectrometry (GC/MS) is the preferred 1900 confirmatory method.     No current facility-administered medications for this encounter.   No current outpatient prescriptions on file.    Musculoskeletal: Strength & Muscle Tone: within normal limits Gait & Station: normal Patient leans: N/A  Psychiatric Specialty Exam: Review of Systems  Constitutional: Negative.   HENT: Negative.   Eyes: Negative.   Respiratory: Negative.   Cardiovascular: Negative.   Gastrointestinal: Negative.   Musculoskeletal: Positive for joint pain.  Skin: Negative.   Neurological: Negative.   Psychiatric/Behavioral: Positive for depression, memory loss and  substance abuse. Negative for suicidal ideas and hallucinations. The patient is nervous/anxious. The patient does not have insomnia.     Blood pressure 138/84, pulse 118, temperature 98.3 F (36.8 C), resp. rate 24, height 5' 5"  (1.651 m), weight 58.968 kg (130 lb), last menstrual period 10/29/2015, SpO2 98 %.Body mass index is 21.63 kg/(m^2).  General Appearance: Fairly Groomed  Engineer, water::  Good  Speech:  Normal Rate  Volume:  Decreased  Mood:  Euthymic  Affect:  Congruent  Thought Process:  Goal Directed  Orientation:  Full  (Time, Place, and Person)  Thought Content:  Negative  Suicidal Thoughts:  No  Homicidal Thoughts:  No  Memory:  Immediate;   Good Recent;   Fair Remote;   Fair  Judgement:  Fair  Insight:  Fair  Psychomotor Activity:  Decreased  Concentration:  Fair  Recall:  [employer] of Knowledge:Fair  Language: Fair  Akathisia:  No  Handed:  Right  AIMS (if indicated):     Assets:  Communication Skills Desire for Improvement Housing Physical Health Resilience  ADL's:  Intact  Cognition: WNL  Sleep:      Treatment Plan Summary: Plan This is a 20 year old woman who came in to the emergency room intoxicated. Patient tells me she thinks her alcohol use is her primary problem. In many ways I think she is probably correct. Sounds like she has most of her behavior problems when intoxicated. Right now she is calm and lucid and totally denies suicidal ideation. No evidence that she actually tried to hurt yourself yesterday. Patient is not having tremors or delirium or any signs of serious alcohol withdrawal. Patient no longer meets commitment criteria and that has been discontinued. Suicidal ideation appears to be a resolved issue for the time being. Patient does have chronic problems with alcohol marijuana and cocaine abuse as well as dysthymia. I have encouraged her to seek outpatient treatment in the community and I will ask referred to [facility] at discharge. Patient educated about alcohol use and its effect on her mood. Case reviewed with the ER doctor. She can be released from the emergency room. Case reviewed with TTS.  Disposition: Patient does not meet criteria for psychiatric inpatient admission. Supportive therapy provided about ongoing stressors.  Alethia Berthold, MD 10/31/2015 12:41 PM

## 2015-10-31 NOTE — ED Notes (Signed)
Patient observed lying in bed with eyes closed  Even, unlabored respirations observed   NAD pt appears to be sleeping  I will continue to monitor along with every 15 minute visual observations and ongoing security monitoring    

## 2015-10-31 NOTE — ED Notes (Signed)
BEHAVIORAL HEALTH ROUNDING Patient sleeping: No. Patient alert and oriented: yes Behavior appropriate: Yes.  ; If no, describe:  Nutrition and fluids offered: yes Toileting and hygiene offered: Yes  Sitter present: q15 minute observations and security  monitoring Law enforcement present: Yes  ODS  

## 2015-10-31 NOTE — ED Notes (Signed)
Patient becoming irate and security said she punched the glass on the door to room ED22.  MD aware.

## 2015-10-31 NOTE — ED Notes (Addendum)
XR called me to ask about patient and if she was still combative.  I told them she was asleep and that I would pass this along to the next shift nurse to allow her and MD to determine if they want to wake her or let her sleep it off a little.  Non-emergent XR.

## 2015-10-31 NOTE — ED Notes (Signed)
ED BHU PLACEMENT JUSTIFICATION Is the patient under IVC or is there intent for IVC: Yes.   Is the patient medically cleared: Yes.   Is there vacancy in the ED BHU: Yes.   Is the population mix appropriate for patient: Yes. - pt to be splinted and will not qualify for transfer to BHU    Is the patient awaiting placement in inpatient or outpatient setting: Yes.   Has the patient had a psychiatric consult: Yes.   Survey of unit performed for contraband, proper placement and condition of furniture, tampering with fixtures in bathroom, shower, and each patient room: Yes.  ; Findings:  APPEARANCE/BEHAVIOR Calm and cooperative NEURO ASSESSMENT Orientation: oriented x3  Denies pain Hallucinations: No.None noted (Hallucinations) Speech: Normal Gait: normal RESPIRATORY ASSESSMENT Even  Unlabored respirations  CARDIOVASCULAR ASSESSMENT Pulses equal   regular rate  Skin warm and dry   GASTROINTESTINAL ASSESSMENT no GI complaint EXTREMITIES Full ROM  PLAN OF CARE Provide calm/safe environment. Vital signs assessed twice daily. ED BHU Assessment once each 12-hour shift. Collaborate with intake RN daily or as condition indicates. Assure the ED provider has rounded once each shift. Provide and encourage hygiene. Provide redirection as needed. Assess for escalating behavior; address immediately and inform ED provider.  Assess family dynamic and appropriateness for visitation as needed: Yes.  ; If necessary, describe findings:  Educate the patient/family about BHU procedures/visitation: Yes.  ; If necessary, describe findings:

## 2015-10-31 NOTE — Discharge Instructions (Signed)
Alcohol Use Disorder °Alcohol use disorder is a mental disorder. It is not a one-time incident of heavy drinking. Alcohol use disorder is the excessive and uncontrollable use of alcohol over time that leads to problems with functioning in one or more areas of daily living. People with this disorder risk harming themselves and others when they drink to excess. Alcohol use disorder also can cause other mental disorders, such as mood and anxiety disorders, and serious physical problems. People with alcohol use disorder often misuse other drugs.  °Alcohol use disorder is common and widespread. Some people with this disorder drink alcohol to cope with or escape from negative life events. Others drink to relieve chronic pain or symptoms of mental illness. People with a family history of alcohol use disorder are at higher risk of losing control and using alcohol to excess.  °Drinking too much alcohol can cause injury, accidents, and health problems. One drink can be too much when you are: °· Working. °· Pregnant or breastfeeding. °· Taking medicines. Ask your doctor. °· Driving or planning to drive. °SYMPTOMS  °Signs and symptoms of alcohol use disorder may include the following:  °· Consumption of alcohol in larger amounts or over a longer period of time than intended. °· Multiple unsuccessful attempts to cut down or control alcohol use.   °· A great deal of time spent obtaining alcohol, using alcohol, or recovering from the effects of alcohol (hangover). °· A strong desire or urge to use alcohol (cravings).   °· Continued use of alcohol despite problems at work, school, or home because of alcohol use.   °· Continued use of alcohol despite problems in relationships because of alcohol use. °· Continued use of alcohol in situations when it is physically hazardous, such as driving a car. °· Continued use of alcohol despite awareness of a physical or psychological problem that is likely related to alcohol use. Physical  problems related to alcohol use can involve the brain, heart, liver, stomach, and intestines. Psychological problems related to alcohol use include intoxication, depression, anxiety, psychosis, delirium, and dementia.   °· The need for increased amounts of alcohol to achieve the same desired effect, or a decreased effect from the consumption of the same amount of alcohol (tolerance). °· Withdrawal symptoms upon reducing or stopping alcohol use, or alcohol use to reduce or avoid withdrawal symptoms. Withdrawal symptoms include: °· Racing heart. °· Hand tremor. °· Difficulty sleeping. °· Nausea. °· Vomiting. °· Hallucinations. °· Restlessness. °· Seizures. °DIAGNOSIS °Alcohol use disorder is diagnosed through an assessment by your health care provider. Your health care provider may start by asking three or four questions to screen for excessive or problematic alcohol use. To confirm a diagnosis of alcohol use disorder, at least two symptoms must be present within a 12-month period. The severity of alcohol use disorder depends on the number of symptoms: °· Mild--two or three. °· Moderate--four or five. °· Severe--six or more. °Your health care provider may perform a physical exam or use results from lab tests to see if you have physical problems resulting from alcohol use. Your health care provider may refer you to a mental health professional for evaluation. °TREATMENT  °Some people with alcohol use disorder are able to reduce their alcohol use to low-risk levels. Some people with alcohol use disorder need to quit drinking alcohol. When necessary, mental health professionals with specialized training in substance use treatment can help. Your health care provider can help you decide how severe your alcohol use disorder is and what type of treatment you need.   The following forms of treatment are available:   Detoxification. Detoxification involves the use of prescription medicines to prevent alcohol withdrawal  symptoms in the first week after quitting. This is important for people with a history of symptoms of withdrawal and for heavy drinkers who are likely to have withdrawal symptoms. Alcohol withdrawal can be dangerous and, in severe cases, cause death. Detoxification is usually provided in a hospital or in-patient substance use treatment facility.  Counseling or talk therapy. Talk therapy is provided by substance use treatment counselors. It addresses the reasons people use alcohol and ways to keep them from drinking again. The goals of talk therapy are to help people with alcohol use disorder find healthy activities and ways to cope with life stress, to identify and avoid triggers for alcohol use, and to handle cravings, which can cause relapse.  Medicines.Different medicines can help treat alcohol use disorder through the following actions:  Decrease alcohol cravings.  Decrease the positive reward response felt from alcohol use.  Produce an uncomfortable physical reaction when alcohol is used (aversion therapy).  Support groups. Support groups are run by people who have quit drinking. They provide emotional support, advice, and guidance. These forms of treatment are often combined. Some people with alcohol use disorder benefit from intensive combination treatment provided by specialized substance use treatment centers. Both inpatient and outpatient treatment programs are available.   This information is not intended to replace advice given to you by your health care provider. Make sure you discuss any questions you have with your health care provider.   Document Released: 07/09/2004 Document Revised: 06/22/2014 Document Reviewed: 09/08/2012 Elsevier Interactive Patient Education 2016 Elsevier Inc.  Metacarpal Fracture Fractures of metacarpals are breaks in the bones of the hand. They extend from the knuckles to the wrist. These bones can break in many ways. There are different ways of treating  these fractures. HOME CARE  Only exercise as told by your doctor.  Return to activities as told by your doctor.  Go to physical therapy as told by your doctor.  Follow your doctor's advice about driving.  Keep the injured hand raised (elevated) above the level of your heart.  If a plaster, fiberglass, or pre-formed splint was applied:  Wear your splint as told and until you are examined again.  Apply ice on the injury for 15-20 minutes at a time, 03-04 times a day. Put the ice in a plastic bag. Place a towel between your skin and the bag.  Do not get your splint or cast wet. Protect it during bathing with a plastic bag.  Loosen the elastic bandage around the splint if your fingers start to get numb, tingle, get cold, or turn blue.  If the splint is plaster, do not lean it on hard surfaces or put pressure on it for 24 hours after it is put on.  Do not  try to scratch the skin under the cast.  Check the skin around the cast every day. You may put lotion on red or sore areas.  Move the fingers of your casted hand several times a day.  Only take medicine as told by your doctor.  Follow up as told by your doctor. This is very important in order to avoid permanent injury, disability, or lasting (chronic) pain. GET HELP RIGHT AWAY IF:   You develop a rash.  You have problems breathing.  You have any allergy problems.  You have more than a small spot of blood from beneath your cast  or splint.  You have redness, puffiness (swelling), or more pain from beneath your cast or splint.  Yellowish-white fluid (pus) comes from beneath your cast or splint.  You develop a temperature by mouth above 102 F (38.9 C), not controlled by medicine.  You have a bad smell coming from under your cast or splint.  You have problems moving any of your fingers. If you do not have a window in your cast for looking at the wound, a fluid or a little bleeding may show up as a stain on the outside  of your cast. Tell your doctor about any stains you see. MAKE SURE YOU:   Understand these instructions.  Will watch your condition.  Will get help right away if you are not doing well or get worse.   This information is not intended to replace advice given to you by your health care provider. Make sure you discuss any questions you have with your health care provider.   Document Released: 11/18/2007 Document Revised: 06/22/2014 Document Reviewed: 03/21/2014 Elsevier Interactive Patient Education [employer]

## 2015-10-31 NOTE — ED Notes (Signed)
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
## 2015-10-31 NOTE — ED Notes (Signed)
BEHAVIORAL HEALTH ROUNDING Patient sleeping: No Patient alert and oriented: Yes Behavior appropriate: No Describe behavior: Agitated and anxious  Nutrition and fluids offered: Yes Toileting and hygiene offered: Yes Sitter present: Behavioral tech rounding every 15 minutes on patient to ensure safety.  Law enforcement present: Yes Patent examinerLaw enforcement agency: [employer]

## 2015-10-31 NOTE — ED Notes (Signed)
Pt sleeping. Breakfast tray placed at bedside 
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
## 2015-10-31 NOTE — ED Notes (Signed)
Patient dressed out in [place] scrubs. 

## 2015-10-31 NOTE — ED Notes (Signed)
She is being discharged to home - visitor is currently in her room - Pam provided her belongings back to her and I will give her backpack to her as she leaves our area  Discharge instructions to be reviewed with her

## 2015-11-02 ENCOUNTER — Encounter: Payer: Self-pay | Admitting: Emergency Medicine

## 2015-11-02 ENCOUNTER — Emergency Department
Admission: EM | Admit: 2015-11-02 | Discharge: 2015-11-02 | Disposition: A | Discharge status: Home / Self Care | Payer: BLUE CROSS/BLUE SHIELD | Attending: Emergency Medicine | Admitting: Emergency Medicine

## 2015-11-02 DIAGNOSIS — Z5321 Procedure and treatment not carried out due to patient leaving prior to being seen by health care provider: Secondary | ICD-10-CM | POA: Insufficient documentation

## 2015-11-02 DIAGNOSIS — F17210 Nicotine dependence, cigarettes, uncomplicated: Secondary | ICD-10-CM | POA: Diagnosis not present

## 2015-11-02 DIAGNOSIS — R440 Auditory hallucinations: Secondary | ICD-10-CM | POA: Diagnosis present

## 2015-11-02 DIAGNOSIS — F1290 Cannabis use, unspecified, uncomplicated: Secondary | ICD-10-CM | POA: Diagnosis not present

## 2015-11-02 HISTORY — DX: Bipolar disorder, unspecified: F31.9

## 2015-11-02 HISTORY — DX: Major depressive disorder, single episode, unspecified: F32.9

## 2015-11-02 HISTORY — DX: Depression, unspecified: F32.A

## 2015-11-02 NOTE — ED Notes (Signed)
Pt here with aunt; says she's been depressed; having auditory hallucinations; "thinking crazy things"; is supposed to be taking medication for depression and bipolar but is currently taking nothing; pt also has been cutting; one superficial laceration to right forearm; pt very defensive in answers; aunt present is prompting answers from pt, telling her to be honest; pt currently is here voluntarily-"I'm not staying up here all night like I'm caged up"

## 2015-11-02 NOTE — ED Notes (Signed)
Spent almost 30 minutes with pt, trying to keep her calm and cooperative; pt had gotten up before EKG and wanted to leave; officer outside door told pt that had been trying to get her to be seen for over an hour and she needed to decide right now if she was staying or going; pt concerned she was going to be "tricked" like she was when she was here 2 nights ago; explained to pt that staying calm and cooperative would be in her best interest, until she can talk to provider; pt decided to come back into room and have EKG performed; officer was outside door with family; pt sat down in chair long enough for EKG and then wanted to see her aunt; first nurse informed pt that her family had left believing she was staying to be seen; pt put her shirt back on and walked out the front door; pt had denied suicidal thoughts in my presence and the presence of her aunt; ambulatory with steady gait when leaving

## 2015-11-03 ENCOUNTER — Emergency Department
Admission: EM | Admit: 2015-11-03 | Discharge: 2015-11-03 | Disposition: A | Discharge status: Home / Self Care | Payer: BLUE CROSS/BLUE SHIELD | Attending: Emergency Medicine | Admitting: Emergency Medicine

## 2015-11-03 ENCOUNTER — Encounter: Payer: Self-pay | Admitting: *Deleted

## 2015-11-03 DIAGNOSIS — F1919 Other psychoactive substance abuse with unspecified psychoactive substance-induced disorder: Secondary | ICD-10-CM | POA: Insufficient documentation

## 2015-11-03 DIAGNOSIS — F329 Major depressive disorder, single episode, unspecified: Secondary | ICD-10-CM | POA: Diagnosis not present

## 2015-11-03 DIAGNOSIS — F1910 Other psychoactive substance abuse, uncomplicated: Secondary | ICD-10-CM

## 2015-11-03 DIAGNOSIS — F17210 Nicotine dependence, cigarettes, uncomplicated: Secondary | ICD-10-CM | POA: Insufficient documentation

## 2015-11-03 DIAGNOSIS — J45909 Unspecified asthma, uncomplicated: Secondary | ICD-10-CM | POA: Insufficient documentation

## 2015-11-03 LAB — URINE DRUG SCREEN, QUALITATIVE (ARMC ONLY)
AMPHETAMINES, UR SCREEN: NOT DETECTED
Barbiturates, Ur Screen: NOT DETECTED
Benzodiazepine, Ur Scrn: NOT DETECTED
Cannabinoid 50 Ng, Ur ~~LOC~~: POSITIVE — AB
Cocaine Metabolite,Ur ~~LOC~~: POSITIVE — AB
MDMA (ECSTASY) UR SCREEN: NOT DETECTED
METHADONE SCREEN, URINE: NOT DETECTED
Opiate, Ur Screen: NOT DETECTED
Phencyclidine (PCP) Ur S: NOT DETECTED
TRICYCLIC, UR SCREEN: NOT DETECTED

## 2015-11-03 LAB — CBC
HCT: 39.6 % (ref 35.0–47.0)
Hemoglobin: 13.6 g/dL (ref 12.0–16.0)
MCH: 33.7 pg (ref 26.0–34.0)
MCHC: 34.4 g/dL (ref 32.0–36.0)
MCV: 98 fL (ref 80.0–100.0)
PLATELETS: 269 10*3/uL (ref 150–440)
RBC: 4.04 MIL/uL (ref 3.80–5.20)
RDW: 15.3 % — ABNORMAL HIGH (ref 11.5–14.5)
WBC: 11.4 10*3/uL — AB (ref 3.6–11.0)

## 2015-11-03 LAB — COMPREHENSIVE METABOLIC PANEL
ALT: 14 U/L (ref 14–54)
AST: 20 U/L (ref 15–41)
Albumin: 4.3 g/dL (ref 3.5–5.0)
Alkaline Phosphatase: 45 U/L (ref 38–126)
Anion gap: 8 (ref 5–15)
BUN: 6 mg/dL (ref 6–20)
CHLORIDE: 111 mmol/L (ref 101–111)
CO2: 20 mmol/L — ABNORMAL LOW (ref 22–32)
Calcium: 8.6 mg/dL — ABNORMAL LOW (ref 8.9–10.3)
Creatinine, Ser: 0.51 mg/dL (ref 0.44–1.00)
GFR calc Af Amer: >60 mL/min (ref >60)
Glucose, Bld: 86 mg/dL (ref 65–99)
POTASSIUM: 3.1 mmol/L — AB (ref 3.5–5.1)
Sodium: 139 mmol/L (ref 135–145)
Total Bilirubin: 0.4 mg/dL (ref 0.3–1.2)
Total Protein: 7.8 g/dL (ref 6.5–8.1)

## 2015-11-03 LAB — SALICYLATE LEVEL

## 2015-11-03 LAB — ETHANOL: ALCOHOL ETHYL (B): 173 mg/dL — AB (ref <5)

## 2015-11-03 LAB — ACETAMINOPHEN LEVEL

## 2015-11-03 MED ORDER — POTASSIUM CHLORIDE CRYS ER 20 MEQ PO TBCR
EXTENDED_RELEASE_TABLET | ORAL | Status: AC
  Filled 2015-11-03: qty 1

## 2015-11-03 MED ORDER — POTASSIUM CHLORIDE CRYS ER 20 MEQ PO TBCR
40.0000 meq | EXTENDED_RELEASE_TABLET | ORAL | Status: DC
  Filled 2015-11-03: qty 2

## 2015-11-03 NOTE — ED Provider Notes (Signed)
Patient has been medically cleared by psychiatry for discharge. She is medically stable.  Jeanette FilbertJonathan E Merrell Borsuk, MD 11/03/15 [phone]

## 2015-11-03 NOTE — BH Assessment (Signed)
Writer spoke with patient about following up with [facility] and provided her with contact information for Peer Support Lorella Nimrod([place] [phone]), to help bridge the gap, until she receives Outpatient Mental Health/Substance Abuse Treatment.  With the permission of the patient, writer forwarded her contact information to Peer Support, in order that he may contact her.   Patient signed the Release of information, in order for writer to communicate with [facility] and a copy placed on paper chart.

## 2015-11-03 NOTE — ED Notes (Signed)
Pt unable to go to [facility] d/t having a removable splint on her arm (ocl)

## 2015-11-03 NOTE — Discharge Instructions (Signed)
Polysubstance Abuse °When people abuse more than one drug or type of drug it is called polysubstance or polydrug abuse. For example, many smokers also drink alcohol. This is one form of polydrug abuse. Polydrug abuse also refers to the use of a drug to counteract an unpleasant effect produced by another drug. It may also be used to help with withdrawal from another drug. People who take stimulants may become agitated. Sometimes this agitation is countered with a tranquilizer. This helps protect against the unpleasant side effects. Polydrug abuse also refers to the use of different drugs at the same time.  °Anytime drug use is interfering with normal living activities, it has become abuse. This includes problems with family and friends. Psychological dependence has developed when your mind tells you that the drug is needed. This is usually followed by physical dependence which has developed when continuing increases of drug are required to get the same feeling or "high". This is known as addiction or chemical dependency. A person's risk is much higher if there is a history of chemical dependency in the family. °SIGNS OF CHEMICAL DEPENDENCY °· You have been told by friends or family that drugs have become a problem. °· You fight when using drugs. °· You are having blackouts (not remembering what you do while using). °· You feel sick from using drugs but continue using. °· You lie about use or amounts of drugs (chemicals) used. °· You need chemicals to get you going. °· You are suffering in work performance or in school because of drug use. °· You get sick from use of drugs but continue to use anyway. °· You need drugs to relate to people or feel comfortable in social situations. °· You use drugs to forget problems. °"Yes" answered to any of the above signs of chemical dependency indicates there are problems. The longer the use of drugs continues, the greater the problems will become. °If there is a family history of  drug or alcohol use, it is best not to experiment with these drugs. Continual use leads to tolerance. After tolerance develops more of the drug is needed to get the same feeling. This is followed by addiction. With addiction, drugs become the most important part of life. It becomes more important to take drugs than participate in the other usual activities of life. This includes relating to friends and family. Addiction is followed by dependency. Dependency is a condition where drugs are now needed not just to get high, but to feel normal. °Addiction cannot be cured but it can be stopped. This often requires outside help and the care of professionals. Treatment centers are listed in the yellow pages under: Cocaine, Narcotics, and Alcoholics Anonymous. Most hospitals and clinics can refer you to a specialized care center. Talk to your caregiver if you need help. °  °This information is not intended to replace advice given to you by your health care provider. Make sure you discuss any questions you have with your health care provider. °  °Document Released: 01/21/2005 Document Revised: 08/24/2011 Document Reviewed: 06/06/2014 °Elsevier Interactive Patient Education ©2016 Elsevier Inc. ° °
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
## 2015-11-03 NOTE — ED Notes (Signed)
Pt's friend porscha  Here to visit - spoke with pt and visitation allowed to take place

## 2015-11-03 NOTE — ED Notes (Signed)
Spoke with father, he states he wants her to be put into a facility and made to go through rehab. Explained the psychiatrist would see her but drug and alcohol tx programs are usually voluntary.

## 2015-11-03 NOTE — ED Notes (Signed)
Pt called friend for ride.
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
## 2016-01-13 ENCOUNTER — Emergency Department
Admission: EM | Admit: 2016-01-13 | Discharge: 2016-01-14 | Disposition: A | Discharge status: Home / Self Care | Payer: BLUE CROSS/BLUE SHIELD | Attending: Emergency Medicine | Admitting: Emergency Medicine

## 2016-01-13 DIAGNOSIS — F1290 Cannabis use, unspecified, uncomplicated: Secondary | ICD-10-CM | POA: Diagnosis not present

## 2016-01-13 DIAGNOSIS — R42 Dizziness and giddiness: Secondary | ICD-10-CM | POA: Diagnosis present

## 2016-01-13 DIAGNOSIS — F1994 Other psychoactive substance use, unspecified with psychoactive substance-induced mood disorder: Secondary | ICD-10-CM | POA: Diagnosis present

## 2016-01-13 DIAGNOSIS — F32A Depression, unspecified: Secondary | ICD-10-CM

## 2016-01-13 DIAGNOSIS — F1410 Cocaine abuse, uncomplicated: Secondary | ICD-10-CM

## 2016-01-13 DIAGNOSIS — F1910 Other psychoactive substance abuse, uncomplicated: Secondary | ICD-10-CM | POA: Diagnosis not present

## 2016-01-13 DIAGNOSIS — J45909 Unspecified asthma, uncomplicated: Secondary | ICD-10-CM | POA: Insufficient documentation

## 2016-01-13 DIAGNOSIS — F1010 Alcohol abuse, uncomplicated: Secondary | ICD-10-CM | POA: Diagnosis not present

## 2016-01-13 DIAGNOSIS — F329 Major depressive disorder, single episode, unspecified: Secondary | ICD-10-CM | POA: Insufficient documentation

## 2016-01-13 DIAGNOSIS — F17210 Nicotine dependence, cigarettes, uncomplicated: Secondary | ICD-10-CM | POA: Diagnosis not present

## 2016-01-13 LAB — CBC
HEMATOCRIT: 39.7 % (ref 35.0–47.0)
Hemoglobin: 14 g/dL (ref 12.0–16.0)
MCH: 34.5 pg — ABNORMAL HIGH (ref 26.0–34.0)
MCHC: 35.3 g/dL (ref 32.0–36.0)
MCV: 97.7 fL (ref 80.0–100.0)
Platelets: 331 10*3/uL (ref 150–440)
RBC: 4.06 MIL/uL (ref 3.80–5.20)
RDW: 14 % (ref 11.5–14.5)
WBC: 8.1 10*3/uL (ref 3.6–11.0)

## 2016-01-13 LAB — BASIC METABOLIC PANEL
ANION GAP: 9 (ref 5–15)
BUN: 7 mg/dL (ref 6–20)
CHLORIDE: 106 mmol/L (ref 101–111)
CO2: 23 mmol/L (ref 22–32)
Calcium: 9.2 mg/dL (ref 8.9–10.3)
Creatinine, Ser: 0.61 mg/dL (ref 0.44–1.00)
GFR calc Af Amer: >60 mL/min (ref >60)
GFR calc non Af Amer: >60 mL/min (ref >60)
Glucose, Bld: 90 mg/dL (ref 65–99)
Potassium: 3.4 mmol/L — ABNORMAL LOW (ref 3.5–5.1)
Sodium: 138 mmol/L (ref 135–145)

## 2016-01-13 LAB — DIFFERENTIAL
BASOS ABS: 0 10*3/uL (ref 0–0.1)
BASOS PCT: 1 %
Eosinophils Absolute: 0.2 10*3/uL (ref 0–0.7)
Eosinophils Relative: 3 %
Lymphocytes Relative: 39 %
Lymphs Abs: 3.1 10*3/uL (ref 1.0–3.6)
MONO ABS: 0.8 10*3/uL (ref 0.2–0.9)
Monocytes Relative: 10 %
NEUTROS ABS: 3.9 10*3/uL (ref 1.4–6.5)
Neutrophils Relative %: 47 %

## 2016-01-13 LAB — ACETAMINOPHEN LEVEL

## 2016-01-13 LAB — TSH: TSH: 1.861 u[IU]/mL (ref 0.350–4.500)

## 2016-01-13 LAB — SALICYLATE LEVEL

## 2016-01-13 LAB — ETHANOL: Alcohol, Ethyl (B): 115 mg/dL — ABNORMAL HIGH (ref <5)

## 2016-01-13 NOTE — BH Assessment (Signed)
Assessment Note  Jeanette Zimmerman is an 20 y.o. female. Jeanette Zimmerman arrived to the ED by way of her parents in a personal vehicle.  She reports that she came in because of her blood pressure.  She reports that she drinks "a lot".  She reports that she drinks because she is depressed. She states that she has been feeling depressed for almost a year.  She reports that she sleeps a lot. She has ruminating thoughts. She reports on focusing on the negatives in her life.  She stated "I believe I have anxiety".  She denied having auditory or visual hallucinations.  She denied having homicidal or suicidal ideation or intent.  She reports drinking 2 cases of beer and a bottle of liquor daily.  She denied current employment.  She reports using cocaine and marijuana.   Diagnosis: Depression, Substance abuse  Past Medical History:  Past Medical History:  Diagnosis Date  . Asthma   . Bipolar 1 disorder (HCC)   . Depression     History reviewed. No pertinent surgical history.  Family History: No family history on file.  Social History:  reports that she has been smoking Cigarettes.  She has been smoking about 0.25 packs per day. She has never used smokeless tobacco. She reports that she drinks alcohol. She reports that she uses drugs, including Marijuana.  Additional Social History:  Alcohol / Drug Use History of alcohol / drug use?: Yes Substance #1 Name of Substance 1: Alcohol 1 - Age of First Use: 11 1 - Amount (size/oz): 2 cases of beer and a bottle of liquor 1 - Frequency: daily 1 - Last Use / Amount: 01/13/2016 Substance #2 Name of Substance 2: Cocaine 2 - Age of First Use: 19 2 - Amount (size/oz): .5 gram 2 - Frequency: daily 2 - Last Use / Amount: 12/13/2015 Substance #3 Name of Substance 3: Marijuana 3 - Age of First Use: 11 3 - Amount (size/oz): 2-3 blunts 3 - Frequency: daily 3 - Last Use / Amount: 01/13/2016  CIWA: CIWA-Ar BP: 137/87 Pulse Rate: (!) 113 Nausea and Vomiting:  no nausea and no vomiting COWS:    Allergies: No Known Allergies  Home Medications:  (Not in a hospital admission)  OB/GYN Status:  Patient's last menstrual period was 12/31/2015.  General Assessment Data Location of Assessment: [facility] ED TTS Assessment: In system Is this a Tele or Face-to-Face Assessment?: Face-to-Face Is this an Initial Assessment or a Re-assessment for this encounter?: Initial Assessment Marital status: Single Maiden name: n/a Is patient pregnant?: No Pregnancy Status: No Living Arrangements: Parent (Father) Can pt return to current living arrangement?: Yes Admission Status: Voluntary Is patient capable of signing voluntary admission?: Yes Referral Source: Self/Family/Friend Insurance type: Scientist, research (physical sciences) Exam [facility] Walk-in ONLY) Medical Exam completed: Yes  Crisis Care Plan Living Arrangements: Parent (Father) Legal Guardian: Other: (Self) Name of Psychiatrist: None Name of Therapist: Unsure  - Only seen once  Education Status Is patient currently in school?: No Current Grade: n/a Highest grade of school patient has completed: 12th Name of school: [employer] person: n/a  Risk to self with the past 6 months Suicidal Ideation: No Has patient been a risk to self within the past 6 months prior to admission? : No Suicidal Intent: No Has patient had any suicidal intent within the past 6 months prior to admission? : No Is patient at risk for suicide?: No Suicidal Plan?: No Has patient had any suicidal plan within the past 6  months prior to admission? : No Access to Means: No What has been your use of drugs/alcohol within the last 12 months?: daily use of alcohol and marijuana, varied use of cocaine Previous Attempts/Gestures: No How many times?: 0 Other Self Harm Risks: substance abuse Triggers for Past Attempts: None known Intentional Self Injurious Behavior: None Family Suicide History: No Recent stressful life event(s): Conflict  (Comment) (relationship with her mother) Persecutory voices/beliefs?: No Depression: Yes Depression Symptoms: Despondent, Feeling worthless/self pity (increased sleep) Substance abuse history and/or treatment for substance abuse?: Yes Suicide prevention information given to non-admitted patients: Not applicable  Risk to Others within the past 6 months Homicidal Ideation: No Does patient have any lifetime risk of violence toward others beyond the six months prior to admission? : No Thoughts of Harm to Others: No Current Homicidal Intent: No Current Homicidal Plan: No Access to Homicidal Means: No Identified Victim: None identified History of harm to others?: No Assessment of Violence: None Noted Violent Behavior Description: denied Does patient have access to weapons?: No Criminal Charges Pending?: No Does patient have a court date: No Is patient on probation?: No  Psychosis Hallucinations: None noted Delusions: None noted  Mental Status Report Appearance/Hygiene: In scrubs, Unremarkable Eye Contact: Fair Motor Activity: Unremarkable Speech: Soft, Logical/coherent Level of Consciousness: Alert Mood: Depressed Affect: Flat Anxiety Level: None Thought Processes: Coherent Judgement: Unimpaired Orientation: Person, Place, Situation, Time Obsessive Compulsive Thoughts/Behaviors: None  Cognitive Functioning Concentration: Normal Memory: Recent Intact IQ: Average Insight: Fair Impulse Control: Fair Appetite: Fair Sleep: Increased Vegetative Symptoms: Staying in bed  ADLScreening [facility] Assessment Services) Patient's cognitive ability adequate to safely complete daily activities?: Yes Patient able to express need for assistance with ADLs?: Yes Independently performs ADLs?: Yes (appropriate for developmental age)  Prior Inpatient Therapy Prior Inpatient Therapy: No Prior Therapy Dates: n/a Prior Therapy Facilty/Provider(s): n/a Reason for Treatment: n/a  Prior  Outpatient Therapy Prior Outpatient Therapy: Yes Prior Therapy Dates: Current (one visit) Prior Therapy Facilty/Provider(s): Unknown name Reason for Treatment: depression Does patient have an ACCT team?: No Does patient have Intensive In-House Services?  : No Does patient have [facility] services? : No Does patient have P4CC services?: No  ADL Screening (condition at time of admission) Patient's cognitive ability adequate to safely complete daily activities?: Yes Patient able to express need for assistance with ADLs?: Yes Independently performs ADLs?: Yes (appropriate for developmental age)       Abuse/Neglect Assessment (Assessment to be complete while patient is alone) Physical Abuse: Denies Verbal Abuse: Denies Sexual Abuse: Denies Exploitation of patient/patient's resources: Denies Self-Neglect: Denies     Merchant navy officer (For Healthcare) Does patient have an advance directive?: No    Additional Information 1:1 In Past 12 Months?: No CIRT Risk: No Elopement Risk: No Does patient have medical clearance?: Yes     Disposition:  Disposition Initial Assessment Completed for this Encounter: Yes Disposition of Patient: Other dispositions  On Site Evaluation by:   Reviewed with Physician:    Justice Deeds 01/13/2016 9:48 PM

## 2016-01-13 NOTE — ED Notes (Signed)
Pt changed into burgundy scrubs and moved into 20 hall psych eval. Cell phone, shorts, underwear and shirt along with shoes locked up in EDBHU.

## 2016-01-13 NOTE — ED Notes (Signed)
Pts grandmother in lobby to talk to RN. Pts family reports pt has been lying at home and showing m ore and more anger towards family members. Pts grandmother reports pt has been sneaking out of house in the middle of the night and she is worried pt has been drinking and experiencing increased depression. Pts grandmother left contact information for family.   Patient's father - [phone] Patient's Grandmother - [phone]

## 2016-01-13 NOTE — ED Notes (Signed)
Pt holding urine cup at this time and aware a specimen is needed.

## 2016-01-13 NOTE — ED Provider Notes (Signed)
[facility] Emergency Department Provider Note        Time seen: ----------------------------------------- 8:09 PM on 01/13/2016 -----------------------------------------    I have reviewed the triage vital signs and the nursing notes.   HISTORY  Chief Complaint Hypertension; Dizziness; and Migraine    HPI Jeanette Zimmerman is a 20 y.o. female who presents to the ER feeling hot and dizzy. This is occurred approximately for last 5 months ago she states it was due to her hypertension. She is unable to recall what her blood pressure was before. She does not currently take any medications. She also reports she been for a depressed, she's had passive suicidal thoughts. She drinks alcohol daily, cocaine weekly. She states she is willing to stay and talk to the psychiatrist.   Past Medical History:  Diagnosis Date  . Asthma   . Bipolar 1 disorder (HCC)   . Depression     Patient Active Problem List   Diagnosis Date Noted  . Substance induced mood disorder (HCC) 10/31/2015  . Alcohol abuse 10/31/2015  . Cocaine abuse 10/31/2015  . Suicidal ideation 10/31/2015  . Dysthymia 10/31/2015    History reviewed. No pertinent surgical history.  Allergies Review of patient's allergies indicates no known allergies.  Social History Social History  Substance Use Topics  . Smoking status: Current Every Day Smoker    Packs/day: 0.25    Types: Cigarettes  . Smokeless tobacco: Never Used  . Alcohol use Yes    Review of Systems Constitutional: Negative for fever. Cardiovascular: Negative for chest pain. Respiratory: Negative for shortness of breath. Gastrointestinal: Negative for abdominal pain, vomiting and diarrhea. Genitourinary: Negative for dysuria. Musculoskeletal: Negative for back pain. Skin: Negative for rash. Neurological: Negative for headaches, focal weakness or numbness. Psychiatric: Positive for depression, polysubstance abuse  10-point  ROS otherwise negative.  ____________________________________________   PHYSICAL EXAM:  VITAL SIGNS: ED Triage Vitals  Enc Vitals Group     BP 01/13/16 1856 137/87     Pulse Rate 01/13/16 1856 (!) 113     Resp 01/13/16 1856 18     Temp 01/13/16 1856 97.6 F (36.4 C)     Temp Source 01/13/16 1856 Oral     SpO2 01/13/16 1856 99 %     Weight 01/13/16 1856 175 lb (79.4 kg)     Height 01/13/16 1856 5\' 5"  (1.651 m)     Head Circumference --      Peak Flow --      Pain Score 01/13/16 1906 8     Pain Loc --      Pain Edu? --      Excl. in GC? --     Constitutional: Alert and oriented.No acute distress Eyes: Conjunctivae are normal. PERRL. Normal extraocular movements. ENT   Head: Normocephalic and atraumatic.   Nose: No congestion/rhinnorhea.   Mouth/Throat: Mucous membranes are moist.   Neck: No stridor. Cardiovascular: Normal rate, regular rhythm. No murmurs, rubs, or gallops. Respiratory: Normal respiratory effort without tachypnea nor retractions. Breath sounds are clear and equal bilaterally. No wheezes/rales/rhonchi. Gastrointestinal: Soft and nontender. Normal bowel sounds Musculoskeletal: Nontender with normal range of motion in all extremities. No lower extremity tenderness nor edema. Neurologic:  Normal speech and language. No gross focal neurologic deficits are appreciated.  Skin:  Skin is warm, dry and intact. No rash noted. Psychiatric: Depressed mood and affect ____________________________________________  EKG: Interpreted by me. Sinus rhythm with a rate of 91 bpm, normal PR interval, normal QRS, normal QT  interval. Nonspecific T-wave changes.  ____________________________________________  ED COURSE:  Pertinent labs & imaging results that were available during my care of the patient were reviewed by me and considered in my medical decision making (see chart for details). Clinical Course  Patient presents to ER with mostly psychiatric related  issues. I will check basic labs, discussed with psychiatry. Patient is willing to stay overnight to speak with a psychiatrist.  Procedures ____________________________________________   LABS (pertinent positives/negatives)  Labs Reviewed  BASIC METABOLIC PANEL - Abnormal; Notable for the following:       Result Value   Potassium 3.4 (*)    All other components within normal limits  CBC - Abnormal; Notable for the following:    MCH 34.5 (*)    All other components within normal limits  URINALYSIS COMPLETEWITH MICROSCOPIC ([facility] ONLY)  CBC WITH DIFFERENTIAL/PLATELET  TSH  URINE DRUG SCREEN, QUALITATIVE ([facility] ONLY)  ETHANOL  ACETAMINOPHEN LEVEL  SALICYLATE LEVEL  POC URINE PREG, ED  ___________________________________________  FINAL ASSESSMENT AND PLAN  Depression, polysubstance abuse  Plan: Patient with labs as dictated above. Patient is in no acute distress, medically stable for psychiatric evaluation. She is not felt to be an immediate threat to herself. She is currently voluntary.   Emily Filbert, MD   Note: This dictation was prepared with Dragon dictation. Any transcriptional errors that result from this process are unintentional    Emily Filbert, MD 01/13/16 2012

## 2016-01-13 NOTE — ED Triage Notes (Signed)
Pt arrives to ER via POV c/o feeling hot and dizziness. States that this occurred approx 5 months ago and it was due to hypertension. PT unable to recall what blood pressure was before. Pt does not currently take any medications for blood pressure.

## 2016-01-13 NOTE — ED Notes (Signed)
Pt unable to provide urine sample at this time. Given labeled cup and instructed to provide sample when able.

## 2016-01-14 DIAGNOSIS — F1994 Other psychoactive substance use, unspecified with psychoactive substance-induced mood disorder: Secondary | ICD-10-CM

## 2016-01-14 NOTE — ED Notes (Signed)
Patient currently denies SI/HI/AVH and pain. Patient states that she feels depressed but is willing to seek outpatient therapy. Patient is calm and cooperative at this time. Maintained on 15 minute checks and observation by security camera for safety.

## 2016-01-14 NOTE — Discharge Instructions (Signed)
Discharge Follow-Up Plan: [facility] and/or SA Services):  Name of Provider Agency Referred:[facility]   Date/Time of Appointment: January 22, 2016 @ 8AM , Please arrive by 8:00 AM to complete new patient paperwork  Address:  [address] ,[address]     [place], [place] [address] Phone: [phone]   Reason: Medication Management/ SA Services

## 2016-01-14 NOTE — ED Notes (Signed)
Patient denies SI/HI/AVH and pain. All belongings returned to patient. Discharge instructions and follow up care reviewed. Patient discharged home with family.

## 2016-01-14 NOTE — ED Notes (Signed)
Patient denies SI/HI/AVH and pain. All belongings returned to patient. Patient discharged home with father. Discharge instructions reviewed.

## 2016-01-14 NOTE — ED Notes (Signed)
Patient asleep in room. No noted distress or abnormal behavior. Will continue 15 minute checks and observation by security cameras for safety. 

## 2016-01-14 NOTE — ED Notes (Signed)
Pt observed sitting up in bed  NAD observed

## 2016-01-14 NOTE — Consult Note (Signed)
[facility] Face-to-Face Psychiatry Consult   Reason for Consult:  Consult for this 20 year old woman with a history of mood symptoms and alcohol abuse who came in voluntarily seeking treatment. Referring Physician:  Lovena Le Patient Identification: Jeanette Zimmerman MRN:  [id number] Principal Diagnosis: Substance induced mood disorder [facility]) Diagnosis:   Patient Active Problem List   Diagnosis Date Noted  . Substance induced mood disorder ([place]) [F19.94] 10/31/2015  . Alcohol abuse [F10.10] 10/31/2015  . Cocaine abuse [F14.10] 10/31/2015  . Dysthymia [F34.1] 10/31/2015    Total Time spent with patient: 1 hour  Subjective:   Jeanette Zimmerman is a 20 y.o. female patient admitted with "depression".  HPI:  Patient interviewed. Chart reviewed. Labs and vitals reviewed. Case discussed with TTS and emergency room physician. This is a 20 year old woman came to the emergency room saying she wanted to get help with her depression. She also reports that she drinks heavily. She claims that she can drink up to 2 cases of beer a day which seems pretty remarkable. She says that it is more typical that she drinks only about 10 beers a day. Yesterday she says that she had 6. Her mood is chronically depressed and has been more down recently. She sleeps a lot during the day and has trouble sleeping at night. She is not working and has little activity during the day. She says that she ruminates a lot about bad things that have happened to her earlier in her life regarding her family situation. She is not reporting any auditory or visual hallucinations. She denies any suicidal thought or intent or homicidal ideation. She is not currently seeing anyone for any kind of mental health treatment at all. In addition to her alcohol use also uses marijuana occasionally by her estimate about once every couple weeks.  Social history: Lives with her father. Not currently working. Says that she has a plan to start working soon at  [employer]. Describes some stressful events that happened with regard to her mother earlier in life.  Medical history: No significant ongoing known medical problems.  Substance abuse history: Says that she's been drinking heavily for quite a while as noted above says that she averages about 10 beers a day. Doesn't appear to of ever made any real efforts to stay involved in treatment. Uses marijuana and cocaine very occasionally but tends to minimize those.  Past Psychiatric History: Patient denies any history of suicide attempts or violence. She denies any history of self injury. She has not been admitted to psychiatric hospitals in the past. She claims that she has not been prescribed medicine or involved in any mental health treatment in the past either.  Risk to Self: Suicidal Ideation: No Suicidal Intent: No Is patient at risk for suicide?: No Suicidal Plan?: No Access to Means: No What has been your use of drugs/alcohol within the last 12 months?: daily use of alcohol and marijuana, varied use of cocaine How many times?: 0 Other Self Harm Risks: substance abuse Triggers for Past Attempts: None known Intentional Self Injurious Behavior: None Risk to Others: Homicidal Ideation: No Thoughts of Harm to Others: No Current Homicidal Intent: No Current Homicidal Plan: No Access to Homicidal Means: No Identified Victim: None identified History of harm to others?: No Assessment of Violence: None Noted Violent Behavior Description: denied Does patient have access to weapons?: No Criminal Charges Pending?: No Does patient have a court date: No Prior Inpatient Therapy: Prior Inpatient Therapy: No Prior Therapy Dates: n/a Prior Therapy Facilty/Provider(s):  n/a Reason for Treatment: n/a Prior Outpatient Therapy: Prior Outpatient Therapy: Yes Prior Therapy Dates: Current (one visit) Prior Therapy Facilty/Provider(s): Unknown name Reason for Treatment: depression Does patient have an ACCT  team?: No Does patient have Intensive In-House Services?  : No Does patient have [facility] services? : No Does patient have P4CC services?: No  Past Medical History:  Past Medical History:  Diagnosis Date  . Asthma   . Bipolar 1 disorder ([place])   . Depression    History reviewed. No pertinent surgical history. Family History: No family history on file. Family Psychiatric  History: Denies any known family history of mental health problems Social History:  History  Alcohol Use  . Yes     History  Drug Use  . Types: Marijuana    Social History   Social History  . Marital status: Single    Spouse name: N/A  . Number of children: N/A  . Years of education: N/A   Social History Main Topics  . Smoking status: Current Every Day Smoker    Packs/day: 0.25    Types: Cigarettes  . Smokeless tobacco: Never Used  . Alcohol use Yes  . Drug use:     Types: Marijuana  . Sexual activity: Not Asked   Other Topics Concern  . None   Social History Narrative  . None   Additional Social History:    Allergies:  No Known Allergies  Labs:  Results for orders placed or performed during the hospital encounter of 01/13/16 (from the past 48 hour(s))  Basic metabolic panel     Status: Abnormal   Collection Time: 01/13/16  7:08 PM  Result Value Ref Range   Sodium 138 135 - 145 mmol/L   Potassium 3.4 (L) 3.5 - 5.1 mmol/L   Chloride 106 101 - 111 mmol/L   CO2 23 22 - 32 mmol/L   Glucose, Bld 90 65 - 99 mg/dL   BUN 7 6 - 20 mg/dL   Creatinine, Ser 0.61 0.44 - 1.00 mg/dL   Calcium 9.2 8.9 - 10.3 mg/dL   GFR calc non Af Amer >60 >60 mL/min   GFR calc Af Amer >60 >60 mL/min    Comment: (NOTE) The eGFR has been calculated using the CKD EPI equation. This calculation has not been validated in all clinical situations. eGFR's persistently <60 mL/min signify possible Chronic Kidney Disease.    Anion gap 9 5 - 15  CBC     Status: Abnormal   Collection Time: 01/13/16  7:08 PM  Result  Value Ref Range   WBC 8.1 3.6 - 11.0 K/uL   RBC 4.06 3.80 - 5.20 MIL/uL   Hemoglobin 14.0 12.0 - 16.0 g/dL   HCT 39.7 35.0 - 47.0 %   MCV 97.7 80.0 - 100.0 fL   MCH 34.5 (H) 26.0 - 34.0 pg   MCHC 35.3 32.0 - 36.0 g/dL   RDW 14.0 11.5 - 14.5 %   Platelets 331 150 - 440 K/uL  Differential     Status: None   Collection Time: 01/13/16  7:08 PM  Result Value Ref Range   Neutrophils Relative % 47 %   Neutro Abs 3.9 1.4 - 6.5 K/uL   Lymphocytes Relative 39 %   Lymphs Abs 3.1 1.0 - 3.6 K/uL   Monocytes Relative 10 %   Monocytes Absolute 0.8 0.2 - 0.9 K/uL   Eosinophils Relative 3 %   Eosinophils Absolute 0.2 0 - 0.7 K/uL   Basophils Relative 1 %  Basophils Absolute 0.0 0 - 0.1 K/uL  TSH     Status: None   Collection Time: 01/13/16  7:10 PM  Result Value Ref Range   TSH 1.861 0.350 - 4.500 uIU/mL  Ethanol     Status: Abnormal   Collection Time: 01/13/16  7:10 PM  Result Value Ref Range   Alcohol, Ethyl (B) 115 (H) <5 mg/dL    Comment:        LOWEST DETECTABLE LIMIT FOR SERUM ALCOHOL IS 5 mg/dL FOR MEDICAL PURPOSES ONLY   Acetaminophen level     Status: Abnormal   Collection Time: 01/13/16  7:10 PM  Result Value Ref Range   Acetaminophen (Tylenol), Serum <10 (L) 10 - 30 ug/mL    Comment:        THERAPEUTIC CONCENTRATIONS VARY SIGNIFICANTLY. A RANGE OF 10-30 ug/mL MAY BE AN EFFECTIVE CONCENTRATION FOR MANY PATIENTS. HOWEVER, SOME ARE BEST TREATED AT CONCENTRATIONS OUTSIDE THIS RANGE. ACETAMINOPHEN CONCENTRATIONS >150 ug/mL AT 4 HOURS AFTER INGESTION AND >50 ug/mL AT 12 HOURS AFTER INGESTION ARE OFTEN ASSOCIATED WITH TOXIC REACTIONS.   Salicylate level     Status: None   Collection Time: 01/13/16  7:10 PM  Result Value Ref Range   Salicylate Lvl <0.7 2.8 - 30.0 mg/dL    No current facility-administered medications for this encounter.    No current outpatient prescriptions on file.    Musculoskeletal: Strength & Muscle Tone: within normal limits Gait & Station:  normal Patient leans: N/A  Psychiatric Specialty Exam: Physical Exam  Constitutional: She appears well-developed and well-nourished.  HENT:  Head: Normocephalic and atraumatic.  Eyes: Conjunctivae are normal. Pupils are equal, round, and reactive to light.  Neck: Normal range of motion.  Cardiovascular: Normal heart sounds.   Respiratory: Effort normal.  GI: Soft.  Musculoskeletal: Normal range of motion.  Neurological: She is alert.  Skin: Skin is warm and dry.  Psychiatric: Judgment normal. Her speech is delayed. She is slowed. Thought content is not paranoid. Cognition and memory are normal. She exhibits a depressed mood. She expresses no suicidal ideation.    Review of Systems  Constitutional: Negative.   HENT: Negative.   Eyes: Negative.   Respiratory: Negative.   Cardiovascular: Negative.   Gastrointestinal: Negative.   Musculoskeletal: Negative.   Skin: Negative.   Neurological: Negative.   Psychiatric/Behavioral: Positive for depression and substance abuse. Negative for hallucinations, memory loss and suicidal ideas. The patient is nervous/anxious. The patient does not have insomnia.     Blood pressure 108/66, pulse 82, temperature 98.1 F (36.7 C), temperature source Oral, resp. rate 18, height 5' 5"  (1.651 m), weight 79.4 kg (175 lb), last menstrual period 12/31/2015, SpO2 97 %.Body mass index is 29.12 kg/m.  General Appearance: Fairly Groomed  Eye Contact:  Fair  Speech:  Normal Rate  Volume:  Decreased  Mood:  Dysphoric  Affect:  Constricted  Thought Process:  Goal Directed  Orientation:  Full (Time, Place, and Person)  Thought Content:  Logical  Suicidal Thoughts:  No  Homicidal Thoughts:  No  Memory:  Immediate;   Good Recent;   Fair Remote;   Fair  Judgement:  Fair  Insight:  Fair  Psychomotor Activity:  Decreased  Concentration:  Concentration: Fair  Recall:  [employer] of Knowledge:  Fair  Language:  Fair  Akathisia:  No  Handed:  Right  AIMS  (if indicated):     Assets:  Communication Skills Desire for Improvement Housing Physical Health Resilience  ADL's:  Intact  Cognition:  WNL  Sleep:        Treatment Plan Summary: Plan 20 year old woman with complaints of depression and alcohol abuse. Currently calm and lucid and physically stable. No evidence of acute suicidality. Does not require inpatient level treatment. Patient can be discharged from the emergency room and has been given referral information to local mental health agencies. Strongly encouraged to stop drinking and get involved with appropriate substance abuse and mental health treatment. Case reviewed with the ER physician and TTS. Patient will be discharged home. No need for new prescriptions.  Disposition: Patient does not meet criteria for psychiatric inpatient admission. Supportive therapy provided about ongoing stressors.  Alethia Berthold, MD 01/14/2016 12:21 PM

## 2016-01-14 NOTE — Progress Notes (Addendum)
TTS has consulted with the pt. Pt states that she recently participated in individual therapy at [facility]. Pt states that she did not receive any pharmacological intervention at that time. Pt. denies any suicidal ideation, plan or intent. Pt. denies the presence of any auditory or visual hallucinations at this time. Patient denies any other medical complaints. The patient does meet admission criteria at this time. This was explained to the pt, who voiced understanding. Patient  was educated about steps to take if suicide or homicide risk level increases between visits. While future psychiatric events cannot be accurately predicted, the patient does not currently require acute inpatient psychiatric care and does not currently meet [facility] involuntary commitment criteria.     The plan is to discharge pt with a referral to [facility]. A follow-up appointment has been scheduled for August 9,2017 @8  am. Pt is in need of an initial assessment and medication management.  Pt has been educated on the likelihood  of alcohol withdraw symptoms and been given information and instructions on how to access  Substance Abuse Treatment. It has been recommended that she return  if any life threatening symptoms occur.   01/14/2016 Cheryl Flash, MS, NCC, LPCA Therapeutic Triage Specialist

## 2016-01-14 NOTE — ED Provider Notes (Signed)
Progress note  11:42 AM 01/14/2016  Patient has been cleared by psychiatry for discharge. Patient is going to be sent for follow-up at the [facility] outpatient department across street from the hospital. Patient was told to return immediately condition worsens.   Leona Carry, MD 01/14/16 [phone]

## 2016-01-14 NOTE — ED Notes (Signed)
ENVIRONMENTAL ASSESSMENT Potentially harmful objects out of patient reach: Yes Personal belongings secured: Yes Patient dressed in hospital provided attire only: Yes Plastic bags out of patient reach: Yes Patient care equipment (cords, cables, call bells, lines, and drains) shortened, removed, or accounted for: Yes Equipment and supplies removed from bottom of stretcher: Yes Potentially toxic materials out of patient reach: Yes Sharps container removed or out of patient reach: Yes  Patient in room sleeping. No signs of distress noted. Maintained on 15 minute checks and observation by security camera for safety.  

## 2016-01-14 NOTE — ED Notes (Signed)
Patient currently speaking with visitor. No signs of distress noted. Maintained on 15 minute checks and observation by security camera for safety.

## 2016-01-22 ENCOUNTER — Ambulatory Visit: Payer: Self-pay | Admitting: Psychiatry

## 2016-01-22 ENCOUNTER — Encounter: Payer: Self-pay | Admitting: Psychiatry

## 2016-01-22 ENCOUNTER — Ambulatory Visit (INDEPENDENT_AMBULATORY_CARE_PROVIDER_SITE_OTHER): Payer: BLUE CROSS/BLUE SHIELD | Admitting: Psychiatry

## 2016-01-22 VITALS — BP 111/74 | HR 76 | Temp 97.8°F | Ht 65.0 in | Wt 169.4 lb

## 2016-01-22 DIAGNOSIS — F1994 Other psychoactive substance use, unspecified with psychoactive substance-induced mood disorder: Secondary | ICD-10-CM

## 2016-01-22 NOTE — Progress Notes (Signed)
Psychiatric Initial Adult Assessment   Patient Identification: Jeanette Zimmerman MRN:  [id number] Date of Evaluation:  01/22/2016 Referral Source: ED  Chief Complaint:   Chief Complaint    Establish Care; Anxiety; Depression; Drug / Alcohol Assessment     Visit Diagnosis:    ICD-9-CM ICD-10-CM   1. Substance induced mood disorder (H[facility]) 292.84 F19.94     History of Present Illness:   She is a 19 year old female who presented for initial assessment accompanied by her husband. She was recently evaluated in the emergency room on August 1 by Dr. Toni Amend. She is long history of using alcohol and cocaine and marijuana reported that she came here to get help with her chronic depression. Patient reported that she can drink up to 2 cases of beer per day which is normal for her. However currently she is drinking up to 4-6 beers on a daily basis. She reported that she has history of chronic depression and is seeking help for the same. However patient has been evaluated at [facility] after her discharge him the emergency room and she has established her care over there. He reported that they are waiting on starting her on therapy as well as psychiatric appointment. She currently denied having any withdrawal symptoms. She denied having any suicidal homicidal ideations or plans. She admits to using marijuana occasionally. She also admits to using cocaine 2 weeks ago. She appeared calm and collective and has a very soft tone of voice.   Associated Signs/Symptoms: Depression Symptoms:  psychomotor retardation, fatigue, hopelessness, loss of energy/fatigue, (Hypo) Manic Symptoms:  Labiality of Mood, Anxiety Symptoms:  Excessive Worry, Psychotic Symptoms:  none PTSD Symptoms: Negative NA  Past Psychiatric History:  Depressed - 1 year Never attempted suicide  No psychiatric hospitalization.   Previous Psychotropic Medications:  Pt  reported that she has never taken any psychotropic medications to help  with the depression  Substance Abuse History in the last 12 months:  Yes.    MJ- 3 blunts - 2 days ago "is not a problem'.  ALcohol 4- 6 beers . Cocaine- 2 weeks, a line Xanax, percocet, Moli.  DOC- MJ Started using drugs at age 61.  Never been in Rehab program.   Consequences of Substance Abuse: Legal Consequences:  legal charges  Past Medical History:  Past Medical History:  Diagnosis Date  . ADHD (attention deficit hyperactivity disorder)   . Anxiety   . Asthma   . Bipolar 1 disorder ([facility])   . Depression    History reviewed. No pertinent surgical history.  Family Psychiatric History:  Denied.   Family History:  Family History  Problem Relation Age of Onset  . Heart murmur Mother   . Diabetes Father   . Asthma Father     Social History:   Social History   Social History  . Marital status: Single    Spouse name: N/A  . Number of children: N/A  . Years of education: N/A   Social History Main Topics  . Smoking status: Current Every Day Smoker    Packs/day: 0.25    Types: Cigarettes    Start date: 01/21/2009  . Smokeless tobacco: Never Used  . Alcohol use 7.8 oz/week    4 Glasses of wine, 7 Cans of beer, 2 Shots of liquor per week  . Drug use:     Types: Marijuana, Cocaine     Comment: last used 2-3 nights ago marijuana;   last used cocain about a month ago  . Sexual  activity: Yes    Partners: Female    Birth control/ protection: None   Other Topics Concern  . None   Social History Narrative  . None    Additional Social History:  Parents divorced. Was living with mother and now moved backed with father. Was working at [employer] -1 year. Dropped out of school in 10 grade. She reported that she has a stressful life with her mother when she was living with her in the past. She is looking for a job at this time.  Allergies:   Allergies  Allergen Reactions  . Penicillins Rash    Metabolic Disorder Labs: No results found for: HGBA1C, MPG No results  found for: PROLACTIN No results found for: CHOL, TRIG, HDL, CHOLHDL, VLDL, LDLCALC   Current Medications: No current outpatient prescriptions on file.   No current facility-administered medications for this visit.     Neurologic: Headache: No Seizure: No Paresthesias:No  Musculoskeletal: Strength & Muscle Tone: within normal limits Gait & Station: normal Patient leans: N/A  Psychiatric Specialty Exam: ROS  Blood pressure 111/74, pulse 76, temperature 97.8 F (36.6 C), temperature source Oral, height 5\' 5"  (1.651 m), weight 169 lb 6.4 oz (76.8 kg), last menstrual period 12/31/2015.Body mass index is 28.19 kg/m.  General Appearance: Casual  Eye Contact:  Fair  Speech:  Slow  Volume:  Decreased  Mood:  Anxious  Affect:  Appropriate  Thought Process:  Coherent  Orientation:  Full (Time, Place, and Person)  Thought Content:  WDL  Suicidal Thoughts:  No  Homicidal Thoughts:  No  Memory:  Immediate;   Fair Recent;   Fair Remote;   Fair  Judgement:  Poor  Insight:  Lacking  Psychomotor Activity:  Decreased  Concentration:  Concentration: Fair and Attention Span: Fair  Recall:  [employer]  Fund of Knowledge:Fair  Language: Fair  Akathisia:  No  Handed:  Right  AIMS (if indicated):    Assets:  Communication Skills Desire for Improvement Physical Health  ADL's:  Intact  Cognition: WNL  Sleep:  poor    Treatment Plan Summary: Medication management   Patient has already established her care with [facility]. She was strongly encouraged to stop drinking and to get involved with the appropriate substance abuse and mental health treatment. She will not be provided any medications at this time and she will follow up with [facility] and continue her treatment over there. Pt and  her father acknowledged and demonstrated understanding   More than 50% of the time spent in psychoeducation, counseling and coordination of care.    This note was generated in part or whole with voice recognition  software. Voice regonition is usually quite accurate but there are transcription errors that can and very often do occur. I apologize for any typographical errors that were not detected and corrected.    Brandy HaleUzma Adeena Bernabe, MD 8/9/20179:43 AM

## 2016-03-24 ENCOUNTER — Ambulatory Visit (HOSPITAL_COMMUNITY): Payer: BLUE CROSS/BLUE SHIELD | Admitting: Psychiatry

## 2016-11-07 ENCOUNTER — Observation Stay
Admission: EM | Admit: 2016-11-07 | Discharge: 2016-11-08 | Disposition: A | Discharge status: Home / Self Care | Payer: BLUE CROSS/BLUE SHIELD | Attending: Internal Medicine | Admitting: Internal Medicine

## 2016-11-07 ENCOUNTER — Encounter: Payer: Self-pay | Admitting: Emergency Medicine

## 2016-11-07 ENCOUNTER — Observation Stay (HOSPITAL_BASED_OUTPATIENT_CLINIC_OR_DEPARTMENT_OTHER)
Admit: 2016-11-07 | Discharge: 2016-11-07 | Disposition: A | Discharge status: Home / Self Care | Payer: BLUE CROSS/BLUE SHIELD | Attending: Internal Medicine | Admitting: Internal Medicine

## 2016-11-07 DIAGNOSIS — F1910 Other psychoactive substance abuse, uncomplicated: Secondary | ICD-10-CM

## 2016-11-07 DIAGNOSIS — J45909 Unspecified asthma, uncomplicated: Secondary | ICD-10-CM | POA: Insufficient documentation

## 2016-11-07 DIAGNOSIS — R7989 Other specified abnormal findings of blood chemistry: Secondary | ICD-10-CM | POA: Diagnosis not present

## 2016-11-07 DIAGNOSIS — F319 Bipolar disorder, unspecified: Secondary | ICD-10-CM | POA: Insufficient documentation

## 2016-11-07 DIAGNOSIS — Z880 Allergy status to penicillin: Secondary | ICD-10-CM | POA: Insufficient documentation

## 2016-11-07 DIAGNOSIS — F909 Attention-deficit hyperactivity disorder, unspecified type: Secondary | ICD-10-CM | POA: Diagnosis not present

## 2016-11-07 DIAGNOSIS — F1010 Alcohol abuse, uncomplicated: Secondary | ICD-10-CM | POA: Diagnosis not present

## 2016-11-07 DIAGNOSIS — F419 Anxiety disorder, unspecified: Secondary | ICD-10-CM | POA: Insufficient documentation

## 2016-11-07 DIAGNOSIS — F17210 Nicotine dependence, cigarettes, uncomplicated: Secondary | ICD-10-CM | POA: Insufficient documentation

## 2016-11-07 DIAGNOSIS — F14129 Cocaine abuse with intoxication, unspecified: Secondary | ICD-10-CM | POA: Insufficient documentation

## 2016-11-07 DIAGNOSIS — R778 Other specified abnormalities of plasma proteins: Secondary | ICD-10-CM | POA: Diagnosis present

## 2016-11-07 DIAGNOSIS — T50901A Poisoning by unspecified drugs, medicaments and biological substances, accidental (unintentional), initial encounter: Secondary | ICD-10-CM | POA: Diagnosis present

## 2016-11-07 DIAGNOSIS — G92 Toxic encephalopathy: Secondary | ICD-10-CM | POA: Insufficient documentation

## 2016-11-07 DIAGNOSIS — R748 Abnormal levels of other serum enzymes: Secondary | ICD-10-CM | POA: Diagnosis present

## 2016-11-07 HISTORY — DX: Poisoning by unspecified drugs, medicaments and biological substances, accidental (unintentional), initial encounter: T50.901A

## 2016-11-07 LAB — URINE DRUG SCREEN, QUALITATIVE (ARMC ONLY)
AMPHETAMINES, UR SCREEN: NOT DETECTED
Barbiturates, Ur Screen: NOT DETECTED
Benzodiazepine, Ur Scrn: NOT DETECTED
Cannabinoid 50 Ng, Ur ~~LOC~~: POSITIVE — AB
Cocaine Metabolite,Ur ~~LOC~~: POSITIVE — AB
MDMA (ECSTASY) UR SCREEN: NOT DETECTED
METHADONE SCREEN, URINE: NOT DETECTED
Opiate, Ur Screen: NOT DETECTED
Phencyclidine (PCP) Ur S: NOT DETECTED
TRICYCLIC, UR SCREEN: NOT DETECTED

## 2016-11-07 LAB — CBC WITH DIFFERENTIAL/PLATELET
BASOS ABS: 0.1 10*3/uL (ref 0–0.1)
Basophils Relative: 1 %
EOS PCT: 1 %
Eosinophils Absolute: 0.1 10*3/uL (ref 0–0.7)
HCT: 42.1 % (ref 35.0–47.0)
Hemoglobin: 14 g/dL (ref 12.0–16.0)
LYMPHS PCT: 19 %
Lymphs Abs: 2.2 10*3/uL (ref 1.0–3.6)
MCH: 33.9 pg (ref 26.0–34.0)
MCHC: 33.2 g/dL (ref 32.0–36.0)
MCV: 102 fL — AB (ref 80.0–100.0)
Monocytes Absolute: 0.2 10*3/uL (ref 0.2–0.9)
Monocytes Relative: 1 %
NEUTROS ABS: 9.2 10*3/uL — AB (ref 1.4–6.5)
Neutrophils Relative %: 78 %
PLATELETS: 291 10*3/uL (ref 150–440)
RBC: 4.12 MIL/uL (ref 3.80–5.20)
RDW: 15.6 % — ABNORMAL HIGH (ref 11.5–14.5)
WBC: 11.7 10*3/uL — AB (ref 3.6–11.0)

## 2016-11-07 LAB — COMPREHENSIVE METABOLIC PANEL
ALT: 16 U/L (ref 14–54)
ANION GAP: 12 (ref 5–15)
AST: 25 U/L (ref 15–41)
Albumin: 4.6 g/dL (ref 3.5–5.0)
Alkaline Phosphatase: 52 U/L (ref 38–126)
BILIRUBIN TOTAL: 0.4 mg/dL (ref 0.3–1.2)
BUN: 7 mg/dL (ref 6–20)
CO2: 20 mmol/L — ABNORMAL LOW (ref 22–32)
Calcium: 8.8 mg/dL — ABNORMAL LOW (ref 8.9–10.3)
Chloride: 105 mmol/L (ref 101–111)
Creatinine, Ser: 1.08 mg/dL — ABNORMAL HIGH (ref 0.44–1.00)
Glucose, Bld: 317 mg/dL — ABNORMAL HIGH (ref 65–99)
POTASSIUM: 3.7 mmol/L (ref 3.5–5.1)
Sodium: 137 mmol/L (ref 135–145)
TOTAL PROTEIN: 8.5 g/dL — AB (ref 6.5–8.1)

## 2016-11-07 LAB — URINALYSIS, COMPLETE (UACMP) WITH MICROSCOPIC
BILIRUBIN URINE: NEGATIVE
Bacteria, UA: NONE SEEN
HGB URINE DIPSTICK: NEGATIVE
Ketones, ur: NEGATIVE mg/dL
LEUKOCYTES UA: NEGATIVE
Nitrite: NEGATIVE
Protein, ur: 30 mg/dL — AB
SPECIFIC GRAVITY, URINE: 1.008 (ref 1.005–1.030)
pH: 6 (ref 5.0–8.0)

## 2016-11-07 LAB — ACETAMINOPHEN LEVEL: Acetaminophen (Tylenol), Serum: <10 ug/mL — ABNORMAL LOW (ref 10–30)

## 2016-11-07 LAB — TROPONIN I
TROPONIN I: 0.07 ng/mL — AB (ref <0.03)
TROPONIN I: 0.26 ng/mL — AB (ref <0.03)
TROPONIN I: 0.34 ng/mL — AB (ref <0.03)
Troponin I: 0.17 ng/mL (ref <0.03)

## 2016-11-07 LAB — POCT PREGNANCY, URINE: Preg Test, Ur: NEGATIVE

## 2016-11-07 LAB — SALICYLATE LEVEL: Salicylate Lvl: <7 mg/dL (ref 2.8–30.0)

## 2016-11-07 LAB — ETHANOL: ALCOHOL ETHYL (B): 75 mg/dL — AB (ref <5)

## 2016-11-07 MED ORDER — NICOTINE 21 MG/24HR TD PT24
21.0000 mg | MEDICATED_PATCH | Freq: Every day | TRANSDERMAL | Status: DC
  Administered 2016-11-07: 21 mg via TRANSDERMAL
  Filled 2016-11-07 (×2): qty 1

## 2016-11-07 MED ORDER — VITAMIN B-1 100 MG PO TABS
100.0000 mg | ORAL_TABLET | Freq: Every day | ORAL | Status: DC
  Administered 2016-11-07 – 2016-11-08 (×2): 100 mg via ORAL
  Filled 2016-11-07 (×2): qty 1

## 2016-11-07 MED ORDER — SODIUM CHLORIDE 0.9 % IV SOLN: 250.0000 mL | INTRAVENOUS | Status: DC | PRN

## 2016-11-07 MED ORDER — ENOXAPARIN SODIUM 40 MG/0.4ML ~~LOC~~ SOLN
40.0000 mg | SUBCUTANEOUS | Status: DC
  Administered 2016-11-07: 40 mg via SUBCUTANEOUS
  Filled 2016-11-07: qty 0.4

## 2016-11-07 MED ORDER — SODIUM CHLORIDE 0.9 % IV BOLUS (SEPSIS)
1000.0000 mL | Freq: Once | INTRAVENOUS | Status: AC
  Administered 2016-11-07: 1000 mL via INTRAVENOUS

## 2016-11-07 MED ORDER — ADULT MULTIVITAMIN W/MINERALS CH
1.0000 | ORAL_TABLET | Freq: Every day | ORAL | Status: DC
  Administered 2016-11-07 – 2016-11-08 (×2): 1 via ORAL
  Filled 2016-11-07 (×2): qty 1

## 2016-11-07 MED ORDER — ONDANSETRON HCL 4 MG/2ML IJ SOLN
4.0000 mg | INTRAMUSCULAR | Status: DC | PRN
  Administered 2016-11-07: 4 mg via INTRAVENOUS
  Filled 2016-11-07: qty 2

## 2016-11-07 MED ORDER — SODIUM CHLORIDE 0.9% FLUSH: 3.0000 mL | INTRAVENOUS | Status: DC | PRN

## 2016-11-07 MED ORDER — SODIUM CHLORIDE 0.9% FLUSH
3.0000 mL | Freq: Two times a day (BID) | INTRAVENOUS | Status: DC
  Administered 2016-11-07 – 2016-11-08 (×3): 3 mL via INTRAVENOUS

## 2016-11-07 MED ORDER — ASPIRIN EC 81 MG PO TBEC
81.0000 mg | DELAYED_RELEASE_TABLET | Freq: Every day | ORAL | Status: DC
  Administered 2016-11-07 – 2016-11-08 (×2): 81 mg via ORAL
  Filled 2016-11-07 (×2): qty 1

## 2016-11-07 NOTE — ED Provider Notes (Signed)
[facility] Emergency Department Provider Note   ____________________________________________   First MD Initiated Contact with Patient 11/07/16 [phone]     (approximate)  I have reviewed the triage vital signs and the nursing notes.   HISTORY  Chief Complaint Drug Overdose    HPI Jeanette Zimmerman is a 21 y.o. female who comes into the hospital today after a drug overdose. The patient according to EMS found foaming at the mouth on the back porch of her home by her father. It was stated that the patient had done some cocaine as well as drank some alcohol. The ambulance was called and they report that the patient's initial O2 sats were in the 20s. The patient was given 8 mg of Narcan initially and woke up. She denies any pain and reports that she just wants to drink water. The patient reports that she remembers nothing of will not answer any questions. She reports that she also doesn't know her medical history and her father knows all of her history. The patient was brought in for evaluation. She denies any suicidal or homicidal ideation.   Past Medical History:  Diagnosis Date  . ADHD (attention deficit hyperactivity disorder)   . Anxiety   . Asthma   . Bipolar 1 disorder (HCC)   . Depression   . Overdose     Patient Active Problem List   Diagnosis Date Noted  . Substance induced mood disorder (HCC) 10/31/2015  . Alcohol abuse 10/31/2015  . Cocaine abuse 10/31/2015  . Dysthymia 10/31/2015    History reviewed. No pertinent surgical history.  Prior to Admission medications   Not on File    Allergies Penicillins  Family History  Problem Relation Age of Onset  . Heart murmur Mother   . Diabetes Father   . Asthma Father     Social History Social History  Substance Use Topics  . Smoking status: Current Every Day Smoker    Packs/day: 0.25    Types: Cigarettes    Start date: 01/21/2009  . Smokeless tobacco: Never Used  . Alcohol use 7.8  oz/week    4 Glasses of wine, 7 Cans of beer, 2 Shots of liquor per week    Review of Systems  Constitutional: No fever/chills Eyes: No visual changes. ENT: No sore throat. Cardiovascular: Denies chest pain. Respiratory: Hypoxia Gastrointestinal: No abdominal pain.  No nausea, no vomiting.  No diarrhea.  No constipation. Genitourinary: Negative for dysuria. Musculoskeletal: Negative for back pain. Skin: Negative for rash. Neurological: Unresponsiveness   ____________________________________________   PHYSICAL EXAM:  VITAL SIGNS: ED Triage Vitals [11/07/16 0615]  Enc Vitals Group     BP 113/72     Pulse 108     Resp 20     Temp      Temp src      SpO2 94%     Weight 175 lb (79.4 kg)     Height 5\' 4"  (1.626 m)     Head Circumference      Peak Flow      Pain Score      Pain Loc      Pain Edu?      Excl. in GC?     Constitutional: Alert and oriented. Well appearing and in mild Eyes: Conjunctivae are normal. PERRL. EOMI. Head: Atraumatic. Nose: No congestion/rhinnorhea. Mouth/Throat: Mucous membranes are moist.  Oropharynx non-erythematous. Cardiovascular: Tachycardia, regular rhythm. Grossly normal heart sounds.  Good peripheral circulation. Respiratory: Normal respiratory effort.  No retractions.  Lungs CTAB. Gastrointestinal: Soft and nontender. No distention. Positive bowel sounds Musculoskeletal: No lower extremity tenderness nor edema.   Neurologic:  Normal speech and language.  Skin:  Skin is warm, dry and intact.  Psychiatric: Mood and affect are normal.   ____________________________________________   LABS (all labs ordered are listed, but only abnormal results are displayed)  Labs Reviewed  COMPREHENSIVE METABOLIC PANEL - Abnormal; Notable for the following:       Result Value   CO2 20 (*)    Glucose, Bld 317 (*)    Creatinine, Ser 1.08 (*)    Calcium 8.8 (*)    Total Protein 8.5 (*)    All other components within normal limits  CBC WITH  DIFFERENTIAL/PLATELET - Abnormal; Notable for the following:    WBC 11.7 (*)    MCV 102.0 (*)    RDW 15.6 (*)    Neutro Abs 9.2 (*)    All other components within normal limits  URINALYSIS, COMPLETE (UACMP) WITH MICROSCOPIC - Abnormal; Notable for the following:    Color, Urine STRAW (*)    APPearance CLEAR (*)    Glucose, UA >=500 (*)    Protein, ur 30 (*)    Squamous Epithelial / LPF 0-5 (*)    All other components within normal limits  ETHANOL - Abnormal; Notable for the following:    Alcohol, Ethyl (B) 75 (*)    All other components within normal limits  ACETAMINOPHEN LEVEL - Abnormal; Notable for the following:    Acetaminophen (Tylenol), Serum <10 (*)    All other components within normal limits  TROPONIN I - Abnormal; Notable for the following:    Troponin I 0.07 (*)    All other components within normal limits  SALICYLATE LEVEL  URINE DRUG SCREEN, QUALITATIVE ([facility] ONLY)  POCT PREGNANCY, URINE   ____________________________________________  EKG  ED ECG REPORT I, Rebecka ApleyWebster,  Allison P, the attending physician, personally viewed and interpreted this ECG.   Date: 11/07/2016  EKG Time: 547  Rate: 112  Rhythm: sinus tachycardia  Axis: normal  Intervals:none  ST&T Change: none  ____________________________________________  RADIOLOGY  none ____________________________________________   PROCEDURES  Procedure(s) performed: None  Procedures  Critical Care performed: No  ____________________________________________   INITIAL IMPRESSION / ASSESSMENT AND PLAN / ED COURSE  Pertinent labs & imaging results that were available during my care of the patient were reviewed by me and considered in my medical decision making (see chart for details).  This is a 21 year old female who comes into the hospital today after a drug overdose. The patient is tachycardic but is agitated and does not want to participate or answer questions. I will check some blood work and then  reassess the patient.     The patient's troponin is elevated at 0.07. I discussed this with the patient but she reports that she does not want to stay in the hospital. We will continue to give the patient some fluids and we will repeat the patient's troponin and approximately 2 hours. The patient's care was signed out to Dr. Darnelle CatalanMalinda who will reassess the patient once he is received her repeat blood work.  ____________________________________________   FINAL CLINICAL IMPRESSION(S) / ED DIAGNOSES  Final diagnoses:  Accidental drug overdose, initial encounter  Polysubstance abuse      NEW MEDICATIONS STARTED DURING THIS VISIT:  New Prescriptions   No medications on file     Note:  This document was prepared using Dragon voice recognition software and may include  unintentional dictation errors.    Rebecka Apley, MD 11/07/16 [phone]

## 2016-11-07 NOTE — H&P (Signed)
Jeanette Zimmerman is an 21 y.o. female.   Chief Complaint: Weakness. HPI: This is a 21 year old female who says last night she was doing cocaine, alcohol and marijuana. Said she was having heart palpitations last night but does remember much about what happened. She was found on the porch by her father earlier this morning foaming at the mouth. EMS reported O2 sats in the 20s. Currently she is awake alert and says she just feels tired. She denies any chest pain. However her repeat troponin was found to be 0.26.  Past Medical History:  Diagnosis Date  . ADHD (attention deficit hyperactivity disorder)   . Anxiety   . Asthma   . Bipolar 1 disorder ([place])   . Depression   . Overdose     History reviewed. No pertinent surgical history.  Family History  Problem Relation Age of Onset  . Heart murmur Mother   . Diabetes Father   . Asthma Father    Social History:  reports that she has been smoking Cigarettes.  She started smoking about 7 years ago. She has been smoking about 0.25 packs per day. She has never used smokeless tobacco. She reports that she drinks about 7.8 oz of alcohol per week . She reports that she uses drugs, including Marijuana and Cocaine.  Allergies:  Allergies  Allergen Reactions  . Penicillins Rash     (Not in a hospital admission)  Results for orders placed or performed during the hospital encounter of 11/07/16 (from the past 48 hour(s))  Comprehensive metabolic panel     Status: Abnormal   Collection Time: 11/07/16  5:34 AM  Result Value Ref Range   Sodium 137 135 - 145 mmol/L   Potassium 3.7 3.5 - 5.1 mmol/L   Chloride 105 101 - 111 mmol/L   CO2 20 (L) 22 - 32 mmol/L   Glucose, Bld 317 (H) 65 - 99 mg/dL   BUN 7 6 - 20 mg/dL   Creatinine, Ser 1.08 (H) 0.44 - 1.00 mg/dL   Calcium 8.8 (L) 8.9 - 10.3 mg/dL   Total Protein 8.5 (H) 6.5 - 8.1 g/dL   Albumin 4.6 3.5 - 5.0 g/dL   AST 25 15 - 41 U/L   ALT 16 14 - 54 U/L   Alkaline Phosphatase 52 38 - 126 U/L   Total Bilirubin 0.4 0.3 - 1.2 mg/dL   GFR calc non Af Amer >60 >60 mL/min   GFR calc Af Amer >60 >60 mL/min    Comment: (NOTE) The eGFR has been calculated using the CKD EPI equation. This calculation has not been validated in all clinical situations. eGFR's persistently <60 mL/min signify possible Chronic Kidney Disease.    Anion gap 12 5 - 15  CBC with Differential     Status: Abnormal   Collection Time: 11/07/16  5:34 AM  Result Value Ref Range   WBC 11.7 (H) 3.6 - 11.0 K/uL   RBC 4.12 3.80 - 5.20 MIL/uL   Hemoglobin 14.0 12.0 - 16.0 g/dL   HCT 42.1 35.0 - 47.0 %   MCV 102.0 (H) 80.0 - 100.0 fL   MCH 33.9 26.0 - 34.0 pg   MCHC 33.2 32.0 - 36.0 g/dL   RDW 15.6 (H) 11.5 - 14.5 %   Platelets 291 150 - 440 K/uL   Neutrophils Relative % 78 %   Neutro Abs 9.2 (H) 1.4 - 6.5 K/uL   Lymphocytes Relative 19 %   Lymphs Abs 2.2 1.0 - 3.6 K/uL  Monocytes Relative 1 %   Monocytes Absolute 0.2 0.2 - 0.9 K/uL   Eosinophils Relative 1 %   Eosinophils Absolute 0.1 0 - 0.7 K/uL   Basophils Relative 1 %   Basophils Absolute 0.1 0 - 0.1 K/uL  Urinalysis, Complete w Microscopic     Status: Abnormal   Collection Time: 11/07/16  5:34 AM  Result Value Ref Range   Color, Urine STRAW (A) YELLOW   APPearance CLEAR (A) CLEAR   Specific Gravity, Urine 1.008 1.005 - 1.030   pH 6.0 5.0 - 8.0   Glucose, UA >=500 (A) NEGATIVE mg/dL   Hgb urine dipstick NEGATIVE NEGATIVE   Bilirubin Urine NEGATIVE NEGATIVE   Ketones, ur NEGATIVE NEGATIVE mg/dL   Protein, ur 30 (A) NEGATIVE mg/dL   Nitrite NEGATIVE NEGATIVE   Leukocytes, UA NEGATIVE NEGATIVE   RBC / HPF 0-5 0 - 5 RBC/hpf   WBC, UA 0-5 0 - 5 WBC/hpf   Bacteria, UA NONE SEEN NONE SEEN   Squamous Epithelial / LPF 0-5 (A) NONE SEEN   Mucous PRESENT    Hyaline Casts, UA PRESENT   Ethanol     Status: Abnormal   Collection Time: 11/07/16  5:34 AM  Result Value Ref Range   Alcohol, Ethyl (B) 75 (H) <5 mg/dL    Comment:        LOWEST DETECTABLE LIMIT  FOR SERUM ALCOHOL IS 5 mg/dL FOR MEDICAL PURPOSES ONLY   Acetaminophen level     Status: Abnormal   Collection Time: 11/07/16  5:34 AM  Result Value Ref Range   Acetaminophen (Tylenol), Serum <10 (L) 10 - 30 ug/mL    Comment:        THERAPEUTIC CONCENTRATIONS VARY SIGNIFICANTLY. A RANGE OF 10-30 ug/mL MAY BE AN EFFECTIVE CONCENTRATION FOR MANY PATIENTS. HOWEVER, SOME ARE BEST TREATED AT CONCENTRATIONS OUTSIDE THIS RANGE. ACETAMINOPHEN CONCENTRATIONS >150 ug/mL AT 4 HOURS AFTER INGESTION AND >50 ug/mL AT 12 HOURS AFTER INGESTION ARE OFTEN ASSOCIATED WITH TOXIC REACTIONS.   Salicylate level     Status: None   Collection Time: 11/07/16  5:34 AM  Result Value Ref Range   Salicylate Lvl <0.3 2.8 - 30.0 mg/dL  Troponin I     Status: Abnormal   Collection Time: 11/07/16  5:34 AM  Result Value Ref Range   Troponin I 0.07 (HH) <0.03 ng/mL    Comment: CRITICAL RESULT CALLED TO, READ BACK BY AND VERIFIED WITH NOEL WEBSTER AT 0641 ON 11/07/16 RWW   Urine Drug Screen, Qualitative ([facility] only)     Status: Abnormal   Collection Time: 11/07/16  5:34 AM  Result Value Ref Range   Tricyclic, Ur Screen NONE DETECTED NONE DETECTED   Amphetamines, Ur Screen NONE DETECTED NONE DETECTED   MDMA (Ecstasy)Ur Screen NONE DETECTED NONE DETECTED   Cocaine Metabolite,Ur [place] POSITIVE (A) NONE DETECTED   Opiate, Ur Screen NONE DETECTED NONE DETECTED   Phencyclidine (PCP) Ur S NONE DETECTED NONE DETECTED   Cannabinoid 50 Ng, Ur [place] POSITIVE (A) NONE DETECTED   Barbiturates, Ur Screen NONE DETECTED NONE DETECTED   Benzodiazepine, Ur Scrn NONE DETECTED NONE DETECTED   Methadone Scn, Ur NONE DETECTED NONE DETECTED    Comment: (NOTE) 491  Tricyclics, urine               Cutoff 1000 ng/mL 200  Amphetamines, urine             Cutoff 1000 ng/mL 300  MDMA (Ecstasy), urine  Cutoff 500 ng/mL 400  Cocaine Metabolite, urine       Cutoff 300 ng/mL 500  Opiate, urine                   Cutoff 300 ng/mL 600   Phencyclidine (PCP), urine      Cutoff 25 ng/mL 700  Cannabinoid, urine              Cutoff 50 ng/mL 800  Barbiturates, urine             Cutoff 200 ng/mL 900  Benzodiazepine, urine           Cutoff 200 ng/mL 1000 Methadone, urine                Cutoff 300 ng/mL 1100 1200 The urine drug screen provides only a preliminary, unconfirmed 1300 analytical test result and should not be used for non-medical 1400 purposes. Clinical consideration and professional judgment should 1500 be applied to any positive drug screen result due to possible 1600 interfering substances. A more specific alternate chemical method 1700 must be used in order to obtain a confirmed analytical result.  1800 Gas chromato graphy / mass spectrometry (GC/MS) is the preferred 1900 confirmatory method.   Pregnancy, urine POC     Status: None   Collection Time: 11/07/16  5:44 AM  Result Value Ref Range   Preg Test, Ur NEGATIVE NEGATIVE    Comment:        THE SENSITIVITY OF THIS METHODOLOGY IS >24 mIU/mL   Troponin I     Status: Abnormal   Collection Time: 11/07/16  8:14 AM  Result Value Ref Range   Troponin I 0.26 (HH) <0.03 ng/mL    Comment: CRITICAL VALUE NOTED. VALUE IS CONSISTENT WITH PREVIOUSLY REPORTED/CALLED VALUE. QSD   No results found.  Review of Systems  Constitutional: Negative for chills and fever.  HENT: Negative for hearing loss.   Eyes: Negative for blurred vision.  Respiratory: Negative for cough.   Cardiovascular: Positive for palpitations. Negative for chest pain.  Gastrointestinal: Negative for nausea and vomiting.  Genitourinary: Negative for dysuria.  Musculoskeletal: Negative for myalgias.  Skin: Negative for rash.  Neurological: Negative for dizziness.    Blood pressure 110/61, pulse 88, resp. rate 19, height 5' 4" (1.626 m), weight 79.4 kg (175 lb), last menstrual period 10/08/2016, SpO2 93 %. Physical Exam  Constitutional: She is oriented to person, place, and time. She appears  well-developed and well-nourished. No distress.  HENT:  Head: Normocephalic.  Mouth/Throat: Oropharynx is clear and moist. No oropharyngeal exudate.  Eyes: Pupils are equal, round, and reactive to light. Left eye exhibits no discharge.  Neck: Neck supple. No JVD present. No tracheal deviation present. No thyromegaly present.  Cardiovascular: Normal rate.   No murmur heard. Respiratory: Effort normal and breath sounds normal. No respiratory distress.  GI: Soft. She exhibits no distension. There is no tenderness.  Musculoskeletal: She exhibits no edema or tenderness.  Lymphadenopathy:    She has no cervical adenopathy.  Neurological: She is alert and oriented to person, place, and time.     Assessment/Plan 1. Elevated troponin. Second troponin is slightly elevated. She's having no symptoms. EKG looks normal. Suspect she may have had coronary spasms from the cocaine. When started on aspirin. We'll check another set of cardiac enzymes in 6 hours. Monitor on telemetry. We'll also get echocardiogram to assess function. If troponins remain elevated or echo shows abnormality then will consult cardiology for further workup. 2.  Cocaine intoxication. Currently patient just feels weak. She's not having any tachycardia or hypertension. So this point we'll just treat when necessary if needed. 3. Alcohol abuse. She's not showing any symptoms of withdrawal and has not in the past. She binge drinks. So will not start CIWA protocol at this point.  Total time spent 35 minutes  Baxter Hire, MD 11/07/2016, 10:34 AM

## 2016-11-07 NOTE — ED Notes (Signed)
CRITICAL LAB: TROPONIN is 0.07, [facility], Dr. Zenda AlpersWebster notified, orders received

## 2016-11-07 NOTE — ED Notes (Signed)
Hooked patient up to 2% O2. Helped patient to the bathroom and hooked her back up to the monitor.

## 2016-11-07 NOTE — ED Provider Notes (Signed)
Second troponin is 0.26. Discussed with Dr. Shirlee LatchMcLean advises admission for observation and echo. Dr. Allena KatzPatel contacted and informed.      Arnaldo NatalMalinda, Malaina Mortellaro F, MD 11/07/16 [phone]

## 2016-11-07 NOTE — ED Triage Notes (Signed)
Patient presents to Emergency Department via [facility] with complaints of overdose.  Per EMS pt was found foaming at mouth, with O2 sats in 20s, pt was given 8mg  Narcan nasal, 2lpm O2 got pt to 96% and ST at 122, 116/60BP.  Pt found by father on back porch, father reports hx of drug use.  Pt admits to ETOH and cocaine (snorting) use tonight.    Pt surly, unwilling to answer questions.

## 2016-11-07 NOTE — Progress Notes (Signed)
Patient is complaining of nausea and is requesting for medication for that. Dr. Cherlynn KaiserSainani notified with a new order for Zofran 4 mg IV every 4 hours as needed for nausea/vomiting. No acute distress noted. Will continue to monitor.

## 2016-11-07 NOTE — Progress Notes (Signed)
*  PRELIMINARY RESULTS* Echocardiogram 2D Echocardiogram has been performed.  Garrel Ridgelikeshia S Belina Mandile 11/07/2016, 3:17 PM

## 2016-11-08 LAB — ECHOCARDIOGRAM COMPLETE
Height: 64 in
Weight: 2800 oz

## 2016-11-08 LAB — HIV ANTIBODY (ROUTINE TESTING W REFLEX): HIV Screen 4th Generation wRfx: NONREACTIVE

## 2016-11-08 LAB — TROPONIN I: Troponin I: 0.11 ng/mL (ref <0.03)

## 2016-11-08 NOTE — Discharge Summary (Signed)
[facility] [place] [facility]   PATIENT NAME: Jeanette Zimmerman    MR#:  [id number]  DATE OF BIRTH:  25-Mar-1996  DATE OF ADMISSION:  11/07/2016   ADMITTING PHYSICIAN: Gracelyn Nurse, MD  DATE OF DISCHARGE: 11/08/2016 10:40 AM  PRIMARY CARE PHYSICIAN: Patient, No Pcp Per   ADMISSION DIAGNOSIS:   Elevated troponin [R74.8] Polysubstance abuse [F19.10] Accidental drug overdose, initial encounter [T50.901A]  DISCHARGE DIAGNOSIS:   Active Problems:   Elevated troponin   SECONDARY DIAGNOSIS:   Past Medical History:  Diagnosis Date  . ADHD (attention deficit hyperactivity disorder)   . Anxiety   . Asthma   . Bipolar 1 disorder (HCC)   . Depression   . Overdose     HOSPITAL COURSE:   21 year old young female with past medical history significant for ADHD, bipolar, alcohol and drug abuse presents to hospital secondary to palpitations and unresponsive episode.  #1 elevated troponin-secondary to coronary vasospasm. Likely from cocaine abuse. -Patient does acknowledge that she has been doing a lot of cocaine yesterday. -Troponins have trended down. She is chest pain-free and very anxious to be discharged. -Echocardiogram shows normal LV function with EF of 55%, mild diastolic dysfunction. Valves look okay.  #2 polysubstance abuse-counseled, however patient not interested at this time. Does marijuana, cocaine and also alcohol use. Did not go into withdrawals in the hospital.  #3 unresponsive episode - toxic encephalopathy secondary to drug abuse. Resolved and is at baseline mental status now.  Patient is alert, oriented and wanted to be discharged. Labs are stable, so being discharged and advised to follow up as outpatient.  DISCHARGE CONDITIONS:   Guarded CONSULTS OBTAINED:    none  DRUG ALLERGIES:   Allergies  Allergen Reactions  . Penicillins Rash   DISCHARGE MEDICATIONS:   Allergies as of 11/08/2016      Reactions   Penicillins Rash       Medication List    You have not been prescribed any medications.      DISCHARGE INSTRUCTIONS:   1. PCP follow-up in 1 week  DIET:   Regular diet  ACTIVITY:   Activity as tolerated  OXYGEN:   Home Oxygen: No.  Oxygen Delivery: room air  DISCHARGE LOCATION:   home   If you experience worsening of your admission symptoms, develop shortness of breath, life threatening emergency, suicidal or homicidal thoughts you must seek medical attention immediately by calling 911 or calling your MD immediately  if symptoms less severe.  You Must read complete instructions/literature along with all the possible adverse reactions/side effects for all the Medicines you take and that have been prescribed to you. Take any new Medicines after you have completely understood and accpet all the possible adverse reactions/side effects.   Please note  You were cared for by a hospitalist during your hospital stay. If you have any questions about your discharge medications or the care you received while you were in the hospital after you are discharged, you can call the unit and asked to speak with the hospitalist on call if the hospitalist that took care of you is not available. Once you are discharged, your primary care physician will handle any further medical issues. Please note that NO REFILLS for any discharge medications will be authorized once you are discharged, as it is imperative that you return to your primary care physician (or establish a relationship with a primary care physician if you do not have one) for your aftercare needs so  that they can reassess your need for medications and monitor your lab values.    On the day of Discharge:  VITAL SIGNS:   Blood pressure 127/77, pulse 86, temperature 97.5 F (36.4 C), temperature source Oral, resp. rate 18, height 5\' 4"  (1.626 m), weight 79.4 kg (175 lb), last menstrual period 10/08/2016, SpO2 99 %.  PHYSICAL EXAMINATION:     GENERAL:  21 y.o.-year-old obese patient lying in the bed with no acute distress.  EYES: Pupils equal, round, reactive to light and accommodation. No scleral icterus. Extraocular muscles intact.  HEENT: Head atraumatic, normocephalic. Oropharynx and nasopharynx clear.  NECK:  Supple, no jugular venous distention. No thyroid enlargement, no tenderness.  LUNGS: Normal breath sounds bilaterally, no wheezing, rales,rhonchi or crepitation. No use of accessory muscles of respiration.  CARDIOVASCULAR: S1, S2 normal. No murmurs, rubs, or gallops.  ABDOMEN: Soft, non-tender, non-distended. Bowel sounds present. No organomegaly or mass.  EXTREMITIES: No pedal edema, cyanosis, or clubbing.  NEUROLOGIC: Cranial nerves II through XII are intact. Muscle strength 5/5 in all extremities. Sensation intact. Gait not checked.  PSYCHIATRIC: The patient is alert and oriented x 3.  SKIN: No obvious rash, lesion, or ulcer.   DATA REVIEW:   CBC  Recent Labs Lab 11/07/16 0534  WBC 11.7*  HGB 14.0  HCT 42.1  PLT 291    Chemistries   Recent Labs Lab 11/07/16 0534  NA 137  K 3.7  CL 105  CO2 20*  GLUCOSE 317*  BUN 7  CREATININE 1.08*  CALCIUM 8.8*  AST 25  ALT 16  ALKPHOS 52  BILITOT 0.4     Microbiology Results  Results for orders placed or performed during the hospital encounter of 09/22/15  Urine culture     Status: None   Collection Time: 09/22/15 11:01 AM  Result Value Ref Range Status   Specimen Description URINE, RANDOM  Final   Special Requests NONE  Final   Culture INSIGNIFICANT GROWTH  Final   Report Status 09/24/2015 FINAL  Final    RADIOLOGY:  No results found.   Management plans discussed with the patient, family and they are in agreement.  CODE STATUS:     Code Status Orders        Start     Ordered   11/07/16 1227  Full code  Continuous     11/07/16 1226    Code Status History    Date Active Date Inactive Code Status Order ID Comments User Context    This patient has a current code status but no historical code status.      TOTAL TIME TAKING CARE OF THIS PATIENT: 37 minutes.    Enid BaasKALISETTI,Tayia Stonesifer M.D on 11/08/2016 at 12:11 PM  Between 7am to 6pm - Pager - [phone]  After 6pm go to [email or web address] - Social research officer, governmentpassword [email or web address]  [facility]  Office  [phone]  CC: Primary care physician; Patient, No Pcp Per   Note: This dictation was prepared with Dragon dictation along with smaller phrase technology. Any transcriptional errors that result from this process are unintentional.

## 2016-11-08 NOTE — Discharge Planning (Signed)
Patient IV and tele removed.  Discharge papers given, explained and educated.  No scripts needed.  Informed of suggested FU appts and patient agreed to call Monday to set up (since office not open today).  RN assessment and VS revealed stability for DC to home.  Once ready, will be walked to front and family transporting home via car.

## 2016-11-16 ENCOUNTER — Emergency Department
Admission: EM | Admit: 2016-11-16 | Discharge: 2016-11-16 | Disposition: A | Discharge status: Home / Self Care | Payer: BLUE CROSS/BLUE SHIELD | Attending: Emergency Medicine | Admitting: Emergency Medicine

## 2016-11-16 ENCOUNTER — Encounter: Payer: Self-pay | Admitting: Emergency Medicine

## 2016-11-16 DIAGNOSIS — F1910 Other psychoactive substance abuse, uncomplicated: Secondary | ICD-10-CM

## 2016-11-16 DIAGNOSIS — F909 Attention-deficit hyperactivity disorder, unspecified type: Secondary | ICD-10-CM | POA: Diagnosis not present

## 2016-11-16 DIAGNOSIS — F1410 Cocaine abuse, uncomplicated: Secondary | ICD-10-CM | POA: Diagnosis not present

## 2016-11-16 DIAGNOSIS — F1010 Alcohol abuse, uncomplicated: Secondary | ICD-10-CM | POA: Insufficient documentation

## 2016-11-16 DIAGNOSIS — F17210 Nicotine dependence, cigarettes, uncomplicated: Secondary | ICD-10-CM | POA: Insufficient documentation

## 2016-11-16 DIAGNOSIS — F1020 Alcohol dependence, uncomplicated: Secondary | ICD-10-CM | POA: Diagnosis present

## 2016-11-16 DIAGNOSIS — F1994 Other psychoactive substance use, unspecified with psychoactive substance-induced mood disorder: Secondary | ICD-10-CM | POA: Diagnosis present

## 2016-11-16 DIAGNOSIS — J45909 Unspecified asthma, uncomplicated: Secondary | ICD-10-CM | POA: Diagnosis not present

## 2016-11-16 DIAGNOSIS — F341 Dysthymic disorder: Secondary | ICD-10-CM | POA: Diagnosis present

## 2016-11-16 DIAGNOSIS — F99 Mental disorder, not otherwise specified: Secondary | ICD-10-CM | POA: Diagnosis present

## 2016-11-16 DIAGNOSIS — F1420 Cocaine dependence, uncomplicated: Secondary | ICD-10-CM | POA: Diagnosis present

## 2016-11-16 LAB — CBC
HCT: 38 % (ref 35.0–47.0)
Hemoglobin: 12.8 g/dL (ref 12.0–16.0)
MCH: 32.8 pg (ref 26.0–34.0)
MCHC: 33.7 g/dL (ref 32.0–36.0)
MCV: 97.3 fL (ref 80.0–100.0)
Platelets: 300 10*3/uL (ref 150–440)
RBC: 3.9 MIL/uL (ref 3.80–5.20)
RDW: 15.5 % — AB (ref 11.5–14.5)
WBC: 11.7 10*3/uL — AB (ref 3.6–11.0)

## 2016-11-16 LAB — COMPREHENSIVE METABOLIC PANEL
ALBUMIN: 4.2 g/dL (ref 3.5–5.0)
ALT: 20 U/L (ref 14–54)
AST: 22 U/L (ref 15–41)
Alkaline Phosphatase: 44 U/L (ref 38–126)
Anion gap: 8 (ref 5–15)
BUN: 9 mg/dL (ref 6–20)
CHLORIDE: 108 mmol/L (ref 101–111)
CO2: 24 mmol/L (ref 22–32)
CREATININE: 0.68 mg/dL (ref 0.44–1.00)
Calcium: 9.2 mg/dL (ref 8.9–10.3)
GFR calc Af Amer: >60 mL/min (ref >60)
GFR calc non Af Amer: >60 mL/min (ref >60)
GLUCOSE: 84 mg/dL (ref 65–99)
Potassium: 3.7 mmol/L (ref 3.5–5.1)
SODIUM: 140 mmol/L (ref 135–145)
Total Bilirubin: 0.4 mg/dL (ref 0.3–1.2)
Total Protein: 7.9 g/dL (ref 6.5–8.1)

## 2016-11-16 LAB — ETHANOL: Alcohol, Ethyl (B): <5 mg/dL (ref <5)

## 2016-11-16 NOTE — ED Notes (Signed)
Pt given meal tray.

## 2016-11-16 NOTE — ED Notes (Signed)
Psychiatrist at bedside

## 2016-11-16 NOTE — Consult Note (Signed)
[facility] Consult   Reason for Consult:  Consult for 21 year old woman with a history of substance abuse who is brought in under involuntary commitment filed by her mother Referring Physician:  Jimmye Norman Patient Identification: Jeanette Zimmerman MRN:  [id number] Principal Diagnosis: Substance induced mood disorder [facility]) Diagnosis:   Patient Active Problem List   Diagnosis Date Noted  . Elevated troponin [R74.8] 11/07/2016  . Substance induced mood disorder ([place]) [F19.94] 10/31/2015  . Alcohol abuse [F10.10] 10/31/2015  . Cocaine abuse [F14.10] 10/31/2015  . Dysthymia [F34.1] 10/31/2015    Total Time spent with patient: 1 hour  Subjective:   Jeanette Zimmerman is a 21 y.o. female patient admitted with "I don't know why they brought me here".  HPI:  Patient interviewed. Chart reviewed. 21 year old woman with a history of substance abuse and mood instability. Mother filed commitment papers today. Patient presented the history as being that she and her girlfriend were just having an argument this afternoon and she called her mother to pick her up. When her mother showed up the police were there as well. When we reviewed the paperwork however the patient admitted that she had been taken over to [place] then to be evaluated. Patient denied that she had said anything to her mother about not caring whether she lived or died. Patient says that her mood is always a little angry and irritable and easily annoyed. Eyes however having any thoughts about wishing she were dead or wanting to die. She says she occasionally feels hopeless because she feels like she has so many problems in her life but has no wish to harm herself. She admits that she was here in the hospital just last week after an overdose of heroin but insists that that was an accident. She says she has not been using any alcohol or drugs since then. Patient denies any hallucinations or psychotic symptoms. Says she does feel tired  much of the time. Doesn't sleep all that well. All of this however appears to be chronic and she tends to downplay any complaints about it. She is not currently receiving any mental health or psychiatric treatment.  Medical history: Denies any significant medical problems ongoing. She was in the hospital last week after an accidental overdose of narcotics. She is not on any prescription medicine.  Social history: Lives with her girlfriend. When confronted about it she admits that her girlfriend is also worried about her. She says the 2 of them fight frequently and she doesn't make much of it. She works at the [employer].  Substance abuse history: Long history of abuse of multiple drugs with alcohol and various pills being her favorite. She says she doesn't use cocaine all at regularly but last week was hanging out with some people who did. She did some lines which turned out to have heroin in them as well. She insists that she does not regularly use narcotics. She admits that alcohol has made her irritable in the past and that maybe it would be a good idea to stop but she has no history of seizures or delirium tremens. Never really engaged in any substance abuse treatment  Past Psychiatric History: Patient has been brought into the hospital under similar circumstances in the past. She denies ever actually trying to kill her self in the past. Never has followed up with outpatient psychiatric treatment never been on any psychiatric medicine. Never tried to kill her self in the past. No history of psychotic symptoms.  Risk  to Self: Is patient at risk for suicide?: Yes Risk to Others:   Prior Inpatient Therapy:   Prior Outpatient Therapy:    Past Medical History:  Past Medical History:  Diagnosis Date  . ADHD (attention deficit hyperactivity disorder)   . Anxiety   . Asthma   . Bipolar 1 disorder ([place])   . Depression   . Overdose    History reviewed. No pertinent surgical history. Family  History:  Family History  Problem Relation Age of Onset  . Heart murmur Mother   . Diabetes Father   . Asthma Father    Family Psychiatric  History: She says that she believes that her biological grandfather was an alcoholic Social History:  History  Alcohol Use  . 7.8 oz/week  . 4 Glasses of wine, 7 Cans of beer, 2 Shots of liquor per week     History  Drug Use  . Types: Marijuana, Cocaine    Comment: last used 2-3 nights ago marijuana;   last used cocain about a month ago    Social History   Social History  . Marital status: Single    Spouse name: N/A  . Number of children: N/A  . Years of education: N/A   Social History Main Topics  . Smoking status: Current Every Day Smoker    Packs/day: 0.25    Types: Cigarettes    Start date: 01/21/2009  . Smokeless tobacco: Never Used  . Alcohol use 7.8 oz/week    4 Glasses of wine, 7 Cans of beer, 2 Shots of liquor per week  . Drug use: Yes    Types: Marijuana, Cocaine     Comment: last used 2-3 nights ago marijuana;   last used cocain about a month ago  . Sexual activity: Yes    Partners: Female    Birth control/ protection: None   Other Topics Concern  . None   Social History Narrative  . None   Additional Social History:    Allergies:   Allergies  Allergen Reactions  . Penicillins Rash    Labs:  Results for orders placed or performed during the hospital encounter of 11/16/16 (from the past 48 hour(s))  Comprehensive metabolic panel     Status: None   Collection Time: 11/16/16  4:53 PM  Result Value Ref Range   Sodium 140 135 - 145 mmol/L   Potassium 3.7 3.5 - 5.1 mmol/L   Chloride 108 101 - 111 mmol/L   CO2 24 22 - 32 mmol/L   Glucose, Bld 84 65 - 99 mg/dL   BUN 9 6 - 20 mg/dL   Creatinine, Ser 0.68 0.44 - 1.00 mg/dL   Calcium 9.2 8.9 - 10.3 mg/dL   Total Protein 7.9 6.5 - 8.1 g/dL   Albumin 4.2 3.5 - 5.0 g/dL   AST 22 15 - 41 U/L   ALT 20 14 - 54 U/L   Alkaline Phosphatase 44 38 - 126 U/L   Total  Bilirubin 0.4 0.3 - 1.2 mg/dL   GFR calc non Af Amer >60 >60 mL/min   GFR calc Af Amer >60 >60 mL/min    Comment: (NOTE) The eGFR has been calculated using the CKD EPI equation. This calculation has not been validated in all clinical situations. eGFR's persistently <60 mL/min signify possible Chronic Kidney Disease.    Anion gap 8 5 - 15  Ethanol     Status: None   Collection Time: 11/16/16  4:53 PM  Result Value Ref Range  Alcohol, Ethyl (B) <5 <5 mg/dL    Comment:        LOWEST DETECTABLE LIMIT FOR SERUM ALCOHOL IS 5 mg/dL FOR MEDICAL PURPOSES ONLY   cbc     Status: Abnormal   Collection Time: 11/16/16  4:53 PM  Result Value Ref Range   WBC 11.7 (H) 3.6 - 11.0 K/uL   RBC 3.90 3.80 - 5.20 MIL/uL   Hemoglobin 12.8 12.0 - 16.0 g/dL   HCT 38.0 35.0 - 47.0 %   MCV 97.3 80.0 - 100.0 fL   MCH 32.8 26.0 - 34.0 pg   MCHC 33.7 32.0 - 36.0 g/dL   RDW 15.5 (H) 11.5 - 14.5 %   Platelets 300 150 - 440 K/uL    No current facility-administered medications for this encounter.    No current outpatient prescriptions on file.    Musculoskeletal: Strength & Muscle Tone: within normal limits Gait & Station: normal Patient leans: N/A  Psychiatric Specialty Exam: Physical Exam  Nursing note and vitals reviewed. Constitutional: She appears well-developed and well-nourished.  HENT:  Head: Normocephalic and atraumatic.  Eyes: Conjunctivae are normal. Pupils are equal, round, and reactive to light.  Neck: Normal range of motion.  Cardiovascular: Regular rhythm and normal heart sounds.   Respiratory: Effort normal. No respiratory distress.  GI: Soft.  Musculoskeletal: Normal range of motion.  Neurological: She is alert.  Skin: Skin is warm and dry.  Psychiatric: Her speech is normal and behavior is normal. Thought content normal. Her mood appears anxious. Cognition and memory are normal. She expresses impulsivity.    Review of Systems  Constitutional: Negative.   HENT: Negative.    Eyes: Negative.   Respiratory: Negative.   Cardiovascular: Negative.   Gastrointestinal: Negative.   Musculoskeletal: Negative.   Skin: Negative.   Neurological: Negative.   Psychiatric/Behavioral: Positive for substance abuse. Negative for depression, hallucinations, memory loss and suicidal ideas. The patient is nervous/anxious and has insomnia.     Blood pressure 112/60, pulse 87, temperature 98.7 F (37.1 C), temperature source Oral, resp. rate 16, height _0  (1.626 m), weight 79.4 kg (175 lb), last menstrual period 11/01/2016, SpO2 100 %.Body mass index is 30.04 kg/m.  General Appearance: Casual  Eye Contact:  Fair  Speech:  Normal Rate  Volume:  Normal  Mood:  Euthymic  Affect:  Constricted  Thought Process:  Goal Directed  Orientation:  Full (Time, Place, and Person)  Thought Content:  Logical  Suicidal Thoughts:  No  Homicidal Thoughts:  No  Memory:  Immediate;   Good Recent;   Fair Remote;   Fair  Judgement:  Impaired  Insight:  Shallow  Psychomotor Activity:  Normal  Concentration:  Concentration: Fair  Recall:  [employer] of Knowledge:  Fair  Language:  Fair  Akathisia:  No  Handed:  Right  AIMS (if indicated):     Assets:  Housing Physical Health  ADL's:  Intact  Cognition:  WNL  Sleep:        Treatment Plan Summary: Plan 21 year old woman with a substance abuse problem and a history of irritability and chronic low mood. She insists that she has no suicidal thoughts at all. She insists that she never said anything about being suicidal to her mother today. Patient presents as slightly irritable but not severely so. Able to control her mood. Thoughts appear to be lucid. Patient is able to cite positive things in her life. Does not appear to be at elevated risk of suicidality and  does appear to be rational and lucid in her thinking. She is strongly encouraged to think hard about how her substance abuse issues are causing problems in her life and to strongly  consider seeking out treatment. She will be given referral information to local mental health agencies. At this point she does not meet commitment criteria. Case reviewed with TTS and emergency room physician. Discontinue IVC.  Disposition: No evidence of imminent risk to self or others at present.   Patient does not meet criteria for psychiatric inpatient admission. Supportive therapy provided about ongoing stressors. Discussed crisis plan, support from social network, calling 911, coming to the Emergency Department, and calling Suicide Hotline.  Alethia Berthold, MD 11/16/2016 6:02 PM

## 2016-11-16 NOTE — ED Notes (Signed)
Mother came to desk asking for update, per RN ally pt is pending discharge after IVC has been recended, mother notified of update, mother voiced concerns for pt safety for discharge, RN ally made aware

## 2016-11-16 NOTE — ED Notes (Signed)
IVC  PAPERS  RESCINDED  PER  DR  CLAPACS  INFORMED  RN   ALLY  

## 2016-11-16 NOTE — ED Notes (Signed)
Silverio DecampConnie Whitley- Mother [phone]

## 2016-11-16 NOTE — ED Notes (Signed)
Pt. Verbalizes understanding of d/c instructions and follow-up. VS stable and pt denies pain.  Pt. In NAD at time of d/c and denies further concerns regarding this visit. Pt. Stable at the time of departure from the unit, departing unit by the safest and most appropriate manner per that pt condition and limitations. Pt advised to return to the ED at any time for emergent concerns, or for new/worsening symptoms.   

## 2016-11-16 NOTE — ED Provider Notes (Signed)
[facility] Emergency Department Provider Note       Time seen: ----------------------------------------- 5:06 PM on 11/16/2016 -----------------------------------------     I have reviewed the triage vital signs and the nursing notes.   HISTORY   Chief Complaint Mental Health Problem    HPI TANDA MORRISSEY is a 21 y.o. female who presents to the ED for involuntary commitment. Patient was seen at [facility] when mom took papers out on her. Petition states she has sepsis abuser and overdosed on crack Place with her when this weekend. She also abuses alcohol and takes other drugs. Drug use is increased and the patient exhibits uncontrollable outbursts. Patient's mom states the patient does not care if she lives or dies.   Past Medical History:  Diagnosis Date  . ADHD (attention deficit hyperactivity disorder)   . Anxiety   . Asthma   . Bipolar 1 disorder ([facility])   . Depression   . Overdose     Patient Active Problem List   Diagnosis Date Noted  . Elevated troponin 11/07/2016  . Substance induced mood disorder ([facility]) 10/31/2015  . Alcohol abuse 10/31/2015  . Cocaine abuse 10/31/2015  . Dysthymia 10/31/2015    History reviewed. No pertinent surgical history.  Allergies Penicillins  Social History Social History  Substance Use Topics  . Smoking status: Current Every Day Smoker    Packs/day: 0.25    Types: Cigarettes    Start date: 01/21/2009  . Smokeless tobacco: Never Used  . Alcohol use 7.8 oz/week    4 Glasses of wine, 7 Cans of beer, 2 Shots of liquor per week    Review of Systems Constitutional: Negative for fever. Eyes: Negative for vision changes ENT:  Negative for congestion, sore throat Cardiovascular: Negative for chest pain. Respiratory: Negative for shortness of breath. Gastrointestinal: Negative for abdominal pain, vomiting and diarrhea. Genitourinary: Negative for dysuria. Musculoskeletal: Negative for back pain. Skin:  Negative for rash. Neurological: Negative for headaches, focal weakness or numbness. Psychiatric: Positive for polysubstance abuse  All systems negative/normal/unremarkable except as stated in the HPI  ____________________________________________   PHYSICAL EXAM:  VITAL SIGNS: ED Triage Vitals [11/16/16 1654]  Enc Vitals Group     BP 112/60     Pulse Rate 87     Resp 16     Temp 98.7 F (37.1 C)     Temp Source Oral     SpO2 100 %     Weight 175 lb (79.4 kg)     Height 5\' 4"  (1.626 m)     Head Circumference      Peak Flow      Pain Score      Pain Loc      Pain Edu?      Excl. in GC?     Constitutional: Alert and oriented. Well appearing and in no distress. Eyes: Conjunctivae are normal. Normal extraocular movements. ENT   Head: Normocephalic and atraumatic.   Nose: No congestion/rhinnorhea.   Mouth/Throat: Mucous membranes are moist.   Neck: No stridor. Cardiovascular: Normal rate, regular rhythm. No murmurs, rubs, or gallops. Respiratory: Normal respiratory effort without tachypnea nor retractions. Breath sounds are clear and equal bilaterally. No wheezes/rales/rhonchi. Gastrointestinal: Soft and nontender. Normal bowel sounds Musculoskeletal: Nontender with normal range of motion in extremities. No lower extremity tenderness nor edema. Neurologic:  Normal speech and language. No gross focal neurologic deficits are appreciated.  Skin:  Skin is warm, dry and intact. No rash noted. Psychiatric: Mood and affect are  normal. Speech and behavior are normal.  ____________________________________________  ED COURSE:  Pertinent labs & imaging results that were available during my care of the patient were reviewed by me and considered in my medical decision making (see chart for details). Patient presents for substance abuse under involuntary commitment, we will assess with labs as indicated.   Procedures ____________________________________________   LABS  (pertinent positives/negatives)  Labs Reviewed  COMPREHENSIVE METABOLIC PANEL  ETHANOL  CBC  URINE DRUG SCREEN, QUALITATIVE ([facility] ONLY)   ____________________________________________  FINAL ASSESSMENT AND PLAN  Polysubstance abuse  Plan: Patient's labs were dictated above. Patient had presented for substance abuse and possible self-destructive and suicidal behavior. Patient appears medically stable for psychiatric evaluation and disposition.   Emily FilbertWilliams, Chayden Garrelts E, MD   Note: This note was generated in part or whole with voice recognition software. Voice recognition is usually quite accurate but there are transcription errors that can and very often do occur. I apologize for any typographical errors that were not detected and corrected.     Emily FilbertWilliams, Cherri Yera E, MD 11/16/16 [phone]

## 2016-11-16 NOTE — ED Notes (Signed)
Pt dressed out into appropriate behavioral health clothing. Pt belongings consist of a camo jacket, a pair of grey shoes, a pair of white socks, blue jean shorts, a white shirt, red boxers, a black bra and two black hair bows. Pt walked back to [facility] by this tech.

## 2016-11-16 NOTE — ED Triage Notes (Signed)
Arrives with [employer] under IVC papers.  [facility] when mom took papers out on patient.  Petition states that patient is a substance abuser and overdosed on crack laced with heroin las weekend.  She also takes Mollies and abuses alcohol.  Drug use has increased and patient exhibits uncontrollable outbursts.  Patient's mom states patient does not care if she lives or dies.

## 2016-11-16 NOTE — ED Provider Notes (Signed)
Patient was cleared by Dr. Toni Amendlapacs for discharge.   Emily FilbertWilliams, Jonathan E, MD 11/16/16 1901

## 2016-11-16 NOTE — ED Notes (Signed)
Pt reports that she got into an argument with her girlfriend PTA and she called her mother to pick her up. Pt was then "followed by sheriff" who made her come to ER. PT concerned with loosing her job due to this visit. Pt denies any thoughts of harming herself or others.

## 2016-11-17 ENCOUNTER — Encounter: Payer: Self-pay | Admitting: Emergency Medicine

## 2016-11-17 ENCOUNTER — Emergency Department
Admission: EM | Admit: 2016-11-17 | Discharge: 2016-11-17 | Disposition: A | Discharge status: Home / Self Care | Payer: BLUE CROSS/BLUE SHIELD | Attending: Emergency Medicine | Admitting: Emergency Medicine

## 2016-11-17 DIAGNOSIS — F1420 Cocaine dependence, uncomplicated: Secondary | ICD-10-CM | POA: Diagnosis present

## 2016-11-17 DIAGNOSIS — J45909 Unspecified asthma, uncomplicated: Secondary | ICD-10-CM | POA: Insufficient documentation

## 2016-11-17 DIAGNOSIS — F918 Other conduct disorders: Secondary | ICD-10-CM | POA: Insufficient documentation

## 2016-11-17 DIAGNOSIS — R45851 Suicidal ideations: Secondary | ICD-10-CM

## 2016-11-17 DIAGNOSIS — F17210 Nicotine dependence, cigarettes, uncomplicated: Secondary | ICD-10-CM | POA: Diagnosis not present

## 2016-11-17 DIAGNOSIS — F1994 Other psychoactive substance use, unspecified with psychoactive substance-induced mood disorder: Secondary | ICD-10-CM | POA: Diagnosis present

## 2016-11-17 DIAGNOSIS — R4689 Other symptoms and signs involving appearance and behavior: Secondary | ICD-10-CM

## 2016-11-17 DIAGNOSIS — R7989 Other specified abnormal findings of blood chemistry: Secondary | ICD-10-CM | POA: Diagnosis present

## 2016-11-17 DIAGNOSIS — F1020 Alcohol dependence, uncomplicated: Secondary | ICD-10-CM | POA: Diagnosis present

## 2016-11-17 DIAGNOSIS — F341 Dysthymic disorder: Secondary | ICD-10-CM | POA: Diagnosis present

## 2016-11-17 DIAGNOSIS — F909 Attention-deficit hyperactivity disorder, unspecified type: Secondary | ICD-10-CM | POA: Insufficient documentation

## 2016-11-17 DIAGNOSIS — R778 Other specified abnormalities of plasma proteins: Secondary | ICD-10-CM | POA: Diagnosis present

## 2016-11-17 NOTE — ED Notes (Signed)
Pt provided sandwich tray and 2 sprites

## 2016-11-17 NOTE — Progress Notes (Signed)
CSW contacted by Patient's RN regarding Patient's father's concerns with Patient having been psychiatrically cleared for discharge. CSW engaged with Patient at her bedside to obtain verbal consent to speak to Patient's father. Patient gave this CSW verbal consent. Patient reports not understanding why she was hospitalized and reports not feeling that she has any concerns with substance abuse problems. Patient verbalized that she is not interested in substance abuse treatment at this time. Patient requested that discharge be conducted quickly as she would like to try to get to work today. RN notified.   CSW engaged with Patient's father, Tama HighKeith Mino, in the waiting room. Patient's father reports that he does not feel that Patient needs to be discharged and reports that he is certain that Patient will be back at the hospital tonight due to her aggression and irritability. CSW discussed with Patient's father that per psychiatry, Patient is probably at some increased risk of dangerous behavior especially when intoxicated but MD  doesn't believe she has any acute intention or plan of harming herself. Patient does not meet commitment criteria and will be released from the emergency room. CSW processed Patient's father's concerns, provided emotional support/brief supportive counseling,  and explained to him that in order for substance abuse treatment to work, Patient is going to have to want to change. CSW explained the readiness of change model with Patient's father. Patient is presently denying any substance abuse concerns. CSW emphasized to Patient's father that while frustrating, a patient cannot be IVC'ed into a residential substance abuse treatment center. Patient would need to be voluntary and could there for check his/her self out at any point in treatment as the facilities legally cannot hold someone against the individuals will. Patient's father expressed understanding. CSW educated Patient's father on  residential substance abuse treatment centers and provided resources including outpatient, inpatient, and intensive outpatient programs. CSW also encouraged Patient's father to have a conversation with his family about not enabling the patient. Patient's father expressed some concerns regarding his safety. CSW explained that if he does not feel safe with having Patient in his home, he will need to tell her she must find other housing. CSW also encouraged Patient's father to contact local law enforcement should Patient become physical , destruct property, or any other criminal offenses such as possessing drugs in his home. Patient's father in agreement. No further social work needs identified. CSW signing off. Please contact should new need(s) arise.    Enos FlingAshley Saif Peter, MSW, LCSW [facility] Clinical Social Worker [phone]

## 2016-11-17 NOTE — ED Provider Notes (Signed)
-----------------------------------------   3:39 PM on 11/17/2016 -----------------------------------------   BP (!) 124/57 (BP Location: Left Arm)   Pulse 65   Temp 97.7 F (36.5 C) (Oral)   Resp 20   Ht 1.626 m (5\' 4" )   Wt 79.4 kg (175 lb)   LMP 11/01/2016 (Approximate)   SpO2 100%   BMI 30.04 kg/m   I spoke in person with Dr. Toni Amendlapacs who evaluated the patient and feels that the patient is safe to be discharged with outpatient follow-up.  The patient is hemodynamically stable and appropriate to go at this time.    Loleta RoseForbach, Amariyana Heacox, MD 11/17/16 1539

## 2016-11-17 NOTE — ED Notes (Signed)
Pt discharged to lobby. Father outside to pick patient up. Pt was stable and appreciative at that time. All papers were given and belongings returned. Verbal understanding expressed. Denies SI/HI and A/VH. Pt given opportunity to express concerns and ask questions.

## 2016-11-17 NOTE — ED Notes (Signed)
MD Webster at bedside at this time.  

## 2016-11-17 NOTE — ED Notes (Addendum)
Patient's father, Caryn BeeKevin, called and is wanting to speak with Dr.or SW about patient's condition. Father is very concerned and feels strongly that patient needs inpatient treatment for substance abuse.  Writer assured father  that message will be relayed.  Caryn BeeKevin [phone]
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
## 2016-11-17 NOTE — ED Notes (Signed)
Pt alert and oriented on admission. Pt denies SI/HI and AVH at this time. Pt mood is sad and affect is flat, although she is cooperative with staff. Writer oriented pt to the BHU and 15 minute checks are ongoing for safety.

## 2016-11-17 NOTE — Discharge Instructions (Signed)

## 2016-11-17 NOTE — ED Notes (Signed)
Writer spoke with father in the lobby. Father reports having concerns about patient's behavior in the car driving home and that he wants to talk to SW before pt is discharged. SW notified and will be meeting with father shortly.

## 2016-11-17 NOTE — ED Notes (Addendum)
Father's visit with patient ended abruptly when pt started cussing saying that she didn't want to talk to him or anybody.

## 2016-11-17 NOTE — ED Notes (Addendum)
Father here to visit patient with patient's permission.

## 2016-11-17 NOTE — ED Notes (Signed)
Pt sitting up in bed eating leftovers from last night's tray.  Pt requests sandwich and sprite while waiting on breakfast tray to arrive. Pt reports poor sleep last night from 'hard mattress". Pt calm and cooperative and voices no complaints other than being tired and wanting to sleep more. Pt remains safe with 15 min checks

## 2016-11-17 NOTE — ED Provider Notes (Signed)
-----------------------------------------   7:57 AM on 11/17/2016 -----------------------------------------   Blood pressure (!) 99/59, pulse 70, temperature 97.6 F (36.4 C), temperature source Oral, resp. rate 13, height 5\' 4"  (1.626 m), weight 79.4 kg (175 lb), last menstrual period 11/01/2016, SpO2 98 %.  The patient had no acute events since last update.  Calm and cooperative at this time.  Disposition is pending Psychiatry/Behavioral Medicine team recommendations.     Jeanmarie PlantMcShane, Lorence Nagengast A, MD 11/17/16 [phone]

## 2016-11-17 NOTE — Consult Note (Signed)
[facility] Psychiatry Consult   Reason for Consult:  Consult for 21 year old woman who was brought back to the emergency room only a few hours after leaving yesterday. Concern once again about suicidal statements Referring Physician:  Jimmye Norman Patient Identification: Jeanette Zimmerman MRN:  [id number] Principal Diagnosis: Substance induced mood disorder [facility]) Diagnosis:   Patient Active Problem List   Diagnosis Date Noted  . Elevated troponin [R74.8] 11/07/2016  . Substance induced mood disorder ([place]) [F19.94] 10/31/2015  . Alcohol abuse [F10.10] 10/31/2015  . Cocaine abuse [F14.10] 10/31/2015  . Dysthymia [F34.1] 10/31/2015    Total Time spent with patient: 1 hour  Subjective:   Jeanette Zimmerman is a 21 y.o. female patient admitted with "I've lost another day of work".  HPI:  Patient seen chart reviewed. See note from yesterday. This is a 21 year old woman with a history of substance abuse mood and irritability. She was seen yesterday in the emergency room because of petition filed by her mother that alleged suicidal ideation. Patient was ultimately thought to not meet commitment criteria and was discharged. Her father took her from the emergency room and apparently the 2 of them got into an argument about money. Father states that the patient made comments about how she might as well kill herself and that will she wished she had died from trying to hurt her self before. Patient denies anything like this. Denies making any statements of any sort that would indicate any thoughts about self-harm. She says that she and her father were just arguing over $70. Patient does not believe that she bears any responsibility for anything that is happening to her. She admitted to me that she consumed one beer after leaving yesterday but denied having used any other drugs or alcohol. Her mood today is irritable. No evidence of psychosis. Totally denies thoughts of hurting herself or anyone  else.  Social history: Patient has a long-term girlfriend but lives with her parents. This continues to be a volatile situation it appears. She works full time at the [employer].  Medical history: No history of ongoing active medical problems no new medical complaints  Substance abuse history: Established history of alcohol and cocaine abuse. No history of DTs or seizures. Gets moody and irritable and has made suicidal statements when intoxicated. Does not have good insight and does not engage in any treatment  Past Psychiatric History: Patient has presented to the hospital several times with similar complaints where it's alleged that she had made suicidal statements and the patient disputes it. Poor insight. Ongoing substance abuse problems. No actual history of suicide attempts in the past.  Risk to Self: Is patient at risk for suicide?: Yes Risk to Others:   Prior Inpatient Therapy:   Prior Outpatient Therapy:    Past Medical History:  Past Medical History:  Diagnosis Date  . ADHD (attention deficit hyperactivity disorder)   . Anxiety   . Asthma   . Bipolar 1 disorder ([place])   . Depression   . Overdose    History reviewed. No pertinent surgical history. Family History:  Family History  Problem Relation Age of Onset  . Heart murmur Mother   . Diabetes Father   . Asthma Father    Family Psychiatric  History: See note from yesterday. No new information Social History:  History  Alcohol Use  . 7.8 oz/week  . 4 Glasses of wine, 7 Cans of beer, 2 Shots of liquor per week     History  Drug  Use  . Types: Marijuana, Cocaine    Comment: last used 2-3 nights ago marijuana;   last used cocain about a month ago    Social History   Social History  . Marital status: Single    Spouse name: N/A  . Number of children: N/A  . Years of education: N/A   Social History Main Topics  . Smoking status: Current Every Day Smoker    Packs/day: 0.25    Types: Cigarettes    Start date:  01/21/2009  . Smokeless tobacco: Never Used  . Alcohol use 7.8 oz/week    4 Glasses of wine, 7 Cans of beer, 2 Shots of liquor per week  . Drug use: Yes    Types: Marijuana, Cocaine     Comment: last used 2-3 nights ago marijuana;   last used cocain about a month ago  . Sexual activity: Yes    Partners: Female    Birth control/ protection: None   Other Topics Concern  . None   Social History Narrative  . None   Additional Social History:    Allergies:   Allergies  Allergen Reactions  . Penicillins Rash    .Has patient had a PCN reaction causing immediate rash, facial/tongue/throat swelling, SOB or lightheadedness with hypotension: Unknown Has patient had a PCN reaction causing severe rash involving mucus membranes or skin necrosis: Unknown Has patient had a PCN reaction that required hospitalization: Unknown Has patient had a PCN reaction occurring within the last 10 years: Unknown If all of the above answers are "NO", then may proceed with Cephalosporin use.    Labs:  Results for orders placed or performed during the hospital encounter of 11/16/16 (from the past 48 hour(s))  Comprehensive metabolic panel     Status: None   Collection Time: 11/16/16  4:53 PM  Result Value Ref Range   Sodium 140 135 - 145 mmol/L   Potassium 3.7 3.5 - 5.1 mmol/L   Chloride 108 101 - 111 mmol/L   CO2 24 22 - 32 mmol/L   Glucose, Bld 84 65 - 99 mg/dL   BUN 9 6 - 20 mg/dL   Creatinine, Ser 0.68 0.44 - 1.00 mg/dL   Calcium 9.2 8.9 - 10.3 mg/dL   Total Protein 7.9 6.5 - 8.1 g/dL   Albumin 4.2 3.5 - 5.0 g/dL   AST 22 15 - 41 U/L   ALT 20 14 - 54 U/L   Alkaline Phosphatase 44 38 - 126 U/L   Total Bilirubin 0.4 0.3 - 1.2 mg/dL   GFR calc non Af Amer >60 >60 mL/min   GFR calc Af Amer >60 >60 mL/min    Comment: (NOTE) The eGFR has been calculated using the CKD EPI equation. This calculation has not been validated in all clinical situations. eGFR's persistently <60 mL/min signify possible  Chronic Kidney Disease.    Anion gap 8 5 - 15  Ethanol     Status: None   Collection Time: 11/16/16  4:53 PM  Result Value Ref Range   Alcohol, Ethyl (B) <5 <5 mg/dL    Comment:        LOWEST DETECTABLE LIMIT FOR SERUM ALCOHOL IS 5 mg/dL FOR MEDICAL PURPOSES ONLY   cbc     Status: Abnormal   Collection Time: 11/16/16  4:53 PM  Result Value Ref Range   WBC 11.7 (H) 3.6 - 11.0 K/uL   RBC 3.90 3.80 - 5.20 MIL/uL   Hemoglobin 12.8 12.0 - 16.0 g/dL  HCT 38.0 35.0 - 47.0 %   MCV 97.3 80.0 - 100.0 fL   MCH 32.8 26.0 - 34.0 pg   MCHC 33.7 32.0 - 36.0 g/dL   RDW 15.5 (H) 11.5 - 14.5 %   Platelets 300 150 - 440 K/uL    No current facility-administered medications for this encounter.    No current outpatient prescriptions on file.    Musculoskeletal: Strength & Muscle Tone: within normal limits Gait & Station: normal Patient leans: N/A  Psychiatric Specialty Exam: Physical Exam  Nursing note and vitals reviewed. Constitutional: She appears well-developed and well-nourished.  HENT:  Head: Normocephalic and atraumatic.  Eyes: Conjunctivae are normal. Pupils are equal, round, and reactive to light.  Neck: Normal range of motion.  Cardiovascular: Regular rhythm and normal heart sounds.   Respiratory: Effort normal. No respiratory distress.  GI: Soft.  Musculoskeletal: Normal range of motion.  Neurological: She is alert.  Skin: Skin is warm and dry.     Psychiatric: Her mood appears anxious. Her speech is delayed. She is withdrawn. Cognition and memory are impaired. She expresses impulsivity. She expresses no homicidal and no suicidal ideation.    Review of Systems  Constitutional: Negative.   HENT: Negative.   Eyes: Negative.   Respiratory: Negative.   Cardiovascular: Negative.   Gastrointestinal: Negative.   Musculoskeletal: Negative.   Skin: Negative.   Neurological: Negative.   Psychiatric/Behavioral: Positive for substance abuse. Negative for depression,  hallucinations, memory loss and suicidal ideas. The patient is nervous/anxious. The patient does not have insomnia.     Blood pressure (!) 99/59, pulse 70, temperature 97.6 F (36.4 C), temperature source Oral, resp. rate 13, height '5\' 4"'$  (1.626 m), weight 79.4 kg (175 lb), last menstrual period 11/01/2016, SpO2 98 %.Body mass index is 30.04 kg/m.  General Appearance: Disheveled  Eye Contact:  Minimal  Speech:  Clear and Coherent  Volume:  Normal  Mood:  Irritable  Affect:  Constricted  Thought Process:  Goal Directed  Orientation:  Full (Time, Place, and Person)  Thought Content:  Rumination and Tangential  Suicidal Thoughts:  No  Homicidal Thoughts:  No  Memory:  Immediate;   Fair Recent;   Fair Remote;   Fair  Judgement:  Impaired  Insight:  Lacking  Psychomotor Activity:  Decreased  Concentration:  Concentration: Fair  Recall:  [employer] of Knowledge:  Fair  Language:  Fair  Akathisia:  No  Handed:  Right  AIMS (if indicated):     Assets:  Communication Skills Housing Physical Health  ADL's:  Intact  Cognition:  WNL  Sleep:        Treatment Plan Summary: Plan 21 year old woman with an established problem with irritability and anger and substance abuse. Patient absolutely denies any suicidal ideation at all. Her insight is poor and I would tend to believe her father that she probably did make some suicidal comments. Patient is probably at some increased risk of dangerous behavior especially when intoxicated but I don't believe she has any acute intention or plan of harming herself. Once again did some education with her around her substance abuse problems and mood and encouraged her to get follow-up mental health treatment. She does not however meet commitment criteria and will be released from the emergency room.  Disposition: No evidence of imminent risk to self or others at present.   Patient does not meet criteria for psychiatric inpatient admission. Supportive  therapy provided about ongoing stressors.  Alethia Berthold, MD 11/17/2016 12:33  PM

## 2016-11-17 NOTE — ED Notes (Signed)
Patient easily aroused from sleep for lunch.

## 2016-11-17 NOTE — ED Notes (Signed)
Breakfast tray brought to patient

## 2016-11-17 NOTE — ED Triage Notes (Signed)
Pt brought in by [employer] deputies for stating "I wish I would've died when I OD last week" and "I'm just gonna kill my damn self" after her father called 911 d/t pt aggressive behavior following father declining to provide pt money in fear that she would purchase drugs. Pt denies drug use since being d/c yesterday by this RN.

## 2017-10-08 ENCOUNTER — Other Ambulatory Visit: Payer: Self-pay

## 2017-10-08 ENCOUNTER — Encounter: Payer: Self-pay | Admitting: *Deleted

## 2017-10-08 ENCOUNTER — Emergency Department
Admission: EM | Admit: 2017-10-08 | Discharge: 2017-10-08 | Disposition: A | Discharge status: Home / Self Care | Payer: BLUE CROSS/BLUE SHIELD | Attending: Emergency Medicine | Admitting: Emergency Medicine

## 2017-10-08 DIAGNOSIS — S80862A Insect bite (nonvenomous), left lower leg, initial encounter: Secondary | ICD-10-CM | POA: Diagnosis present

## 2017-10-08 DIAGNOSIS — Y939 Activity, unspecified: Secondary | ICD-10-CM | POA: Insufficient documentation

## 2017-10-08 DIAGNOSIS — J45909 Unspecified asthma, uncomplicated: Secondary | ICD-10-CM | POA: Diagnosis not present

## 2017-10-08 DIAGNOSIS — F17210 Nicotine dependence, cigarettes, uncomplicated: Secondary | ICD-10-CM | POA: Diagnosis not present

## 2017-10-08 DIAGNOSIS — W57XXXA Bitten or stung by nonvenomous insect and other nonvenomous arthropods, initial encounter: Secondary | ICD-10-CM | POA: Insufficient documentation

## 2017-10-08 DIAGNOSIS — Y929 Unspecified place or not applicable: Secondary | ICD-10-CM | POA: Diagnosis not present

## 2017-10-08 DIAGNOSIS — Y999 Unspecified external cause status: Secondary | ICD-10-CM | POA: Insufficient documentation

## 2017-10-08 MED ORDER — HYDROXYZINE HCL 25 MG PO TABS: 25.0000 mg | ORAL_TABLET | Freq: Four times a day (QID) | ORAL | 0 refills | Status: DC | PRN

## 2017-10-08 MED ORDER — DOXYCYCLINE HYCLATE 100 MG PO CAPS: 100.0000 mg | ORAL_CAPSULE | Freq: Two times a day (BID) | ORAL | 0 refills | Status: DC

## 2017-10-08 NOTE — ED Provider Notes (Signed)
[facility] Emergency Department Provider Note  ____________________________________________   First MD Initiated Contact with Patient 10/08/17 1245     (approximate)  I have reviewed the triage vital signs and the nursing notes.   HISTORY  Chief Complaint Insect Bite  HPI Jeanette Zimmerman is a 22 y.o. female here complaint of left leg itching.  Patient states that 2 weeks ago she noticed a black spot on her leg but in the last several days there has been a red area around it which now itches a lot.  Patient has not used any over-the-counter medication for itching.  She denies any fever, chills, headache or joint pain.  She is not aware of a tick bite but is fearful because she found one in her room this morning.  She denies any pain.   Past Medical History:  Diagnosis Date  . ADHD (attention deficit hyperactivity disorder)   . Anxiety   . Asthma   . Bipolar 1 disorder (HCC)   . Depression   . Overdose     Patient Active Problem List   Diagnosis Date Noted  . Elevated troponin 11/07/2016  . Substance induced mood disorder (HCC) 10/31/2015  . Alcohol abuse 10/31/2015  . Cocaine abuse (HCC) 10/31/2015  . Dysthymia 10/31/2015    History reviewed. No pertinent surgical history.  Prior to Admission medications   Medication Sig Start Date End Date Taking? Authorizing Provider  doxycycline (VIBRAMYCIN) 100 MG capsule Take 1 capsule (100 mg total) by mouth 2 (two) times daily. 10/08/17   Tommi Rumps, PA-C  hydrOXYzine (ATARAX/VISTARIL) 25 MG tablet Take 1 tablet (25 mg total) by mouth every 6 (six) hours as needed for itching. 10/08/17   Tommi Rumps, PA-C    Allergies Penicillins  Family History  Problem Relation Age of Onset  . Heart murmur Mother   . Diabetes Father   . Asthma Father     Social History Social History   Tobacco Use  . Smoking status: Current Every Day Smoker    Packs/day: 0.25    Types: Cigarettes    Start  date: 01/21/2009  . Smokeless tobacco: Never Used  Substance Use Topics  . Alcohol use: Yes    Alcohol/week: 7.8 oz    Types: 4 Glasses of wine, 7 Cans of beer, 2 Shots of liquor per week  . Drug use: Yes    Types: Marijuana, Cocaine    Comment: last used 2-3 nights ago marijuana;   last used cocain about a month ago    Review of Systems Constitutional: No fever/chills Cardiovascular: Denies chest pain. Respiratory: Denies shortness of breath. Gastrointestinal:  No nausea, no vomiting.   Musculoskeletal: Negative for back pain. Skin: Positive for rash.   Neurological: Negative for headaches, focal weakness or numbness. ___________________________________________   PHYSICAL EXAM:  VITAL SIGNS: ED Triage Vitals [10/08/17 1230]  Enc Vitals Group     BP 128/76     Pulse Rate 90     Resp 16     Temp 97.8 F (36.6 C)     Temp Source Oral     SpO2 98 %     Weight 180 lb (81.6 kg)     Height 5\' 5"  (1.651 m)     Head Circumference      Peak Flow      Pain Score 0     Pain Loc      Pain Edu?      Excl. in GC?  Constitutional: Alert and oriented. Well appearing and in no acute distress. Eyes: Conjunctivae are normal.  Head: Atraumatic. Neck: No stridor.   Cardiovascular: Normal rate, regular rhythm. Grossly normal heart sounds.  Good peripheral circulation. Respiratory: Normal respiratory effort.  No retractions. Lungs CTAB. Musculoskeletal: No lower extremity tenderness nor edema.  No joint effusions. Neurologic:  Normal speech and language. No gross focal neurologic deficits are appreciated. No gait instability. Skin:  Skin is warm, dry.  There is a single dark circular area in the center of an erythematous macular area.  There is no drainage present.  There is also some erythematous macular areas that satellite around the original area.  Skin appears to be warm.  No drainage is noted from any of the areas.  No skin edema present.  Joint above and below this area is  nonedematous.   Psychiatric: Mood and affect are normal. Speech and behavior are normal.  ____________________________________________   LABS (all labs ordered are listed, but only abnormal results are displayed)  Labs Reviewed - No data to display    PROCEDURES  Procedure(s) performed: None  Procedures  Critical Care performed: No  ____________________________________________   INITIAL IMPRESSION / ASSESSMENT AND PLAN / ED COURSE  As part of my medical decision making, I reviewed the following data within the electronic MEDICAL RECORD NUMBER Notes from prior ED visits and [place] Controlled Substance Database  Patient was placed on doxycycline 100 mg twice daily for 7 days and Atarax 25 mg every 6 hours as needed for itching.  Patient is aware that she can obtain over-the-counter cortisone cream also if needed for itching.  She is to [facility] if any continued problems.  ____________________________________________   FINAL CLINICAL IMPRESSION(S) / ED DIAGNOSES  Final diagnoses:  Insect bite of left lower leg, initial encounter     ED Discharge Orders        Ordered    doxycycline (VIBRAMYCIN) 100 MG capsule  2 times daily,   Status:  Discontinued     10/08/17 1310    doxycycline (VIBRAMYCIN) 100 MG capsule  2 times daily     10/08/17 1310    hydrOXYzine (ATARAX/VISTARIL) 25 MG tablet  Every 6 hours PRN     10/08/17 1310       Note:  This document was prepared using Dragon voice recognition software and may include unintentional dictation errors.    Tommi RumpsSummers, Rhonda L, PA-C 10/08/17 1348    Emily FilbertWilliams, Jonathan E, MD 10/08/17 [phone]

## 2017-10-08 NOTE — Discharge Instructions (Addendum)
[facility] if any continued problems.  Begin taking doxycycline 100 mg twice daily for 7 days.  Atarax 25 mg every 6 hours as needed for itching.  You may also obtain over-the-counter cortisone cream to rub into the area if needed for better itching control.

## 2017-10-08 NOTE — ED Notes (Signed)
See triage note  States she noticed a possible insect bite to left lower leg about 1 week ago  Then about 2-3 days ago area became red and tender

## 2017-10-08 NOTE — ED Triage Notes (Signed)
Pt to ED reporting a small black spot on her left shin for the past two weeks that started to have redness and irritation noted around it a week ago with increased spreading into the back of pts knee three days ago. Red area is reported to be itchy but no pain reported. No drainage, no fevers, no NVD. PT unsure what could have caused the irritation but found a tick in her room this morning and reports fear that she was bitten by a tick.

## 2017-12-01 ENCOUNTER — Emergency Department
Admission: EM | Admit: 2017-12-01 | Discharge: 2017-12-01 | Disposition: A | Discharge status: Home / Self Care | Payer: BLUE CROSS/BLUE SHIELD | Attending: Emergency Medicine | Admitting: Emergency Medicine

## 2017-12-01 ENCOUNTER — Other Ambulatory Visit: Payer: Self-pay

## 2017-12-01 DIAGNOSIS — F17210 Nicotine dependence, cigarettes, uncomplicated: Secondary | ICD-10-CM | POA: Insufficient documentation

## 2017-12-01 DIAGNOSIS — Z7689 Persons encountering health services in other specified circumstances: Secondary | ICD-10-CM | POA: Insufficient documentation

## 2017-12-01 DIAGNOSIS — J45909 Unspecified asthma, uncomplicated: Secondary | ICD-10-CM | POA: Insufficient documentation

## 2017-12-01 DIAGNOSIS — Z79899 Other long term (current) drug therapy: Secondary | ICD-10-CM | POA: Insufficient documentation

## 2017-12-01 NOTE — ED Triage Notes (Signed)
Pt states that she needs a doctor to sign a release to go back to work after right hand injury

## 2017-12-01 NOTE — ED Notes (Signed)
Pt here for right hand wound recheck .  Pt needs paperwork to return to work.  Pt denies any pain to right hand.  Pt alert.

## 2017-12-01 NOTE — ED Provider Notes (Signed)
[facility] Emergency Department Provider Note  ____________________________________________  Time seen: Approximately 4:10 PM  I have reviewed the triage vital signs and the nursing notes.   HISTORY  Chief Complaint Letter for School/Work    HPI Jeanette Zimmerman is a 22 y.o. female who presents the emergency department for work note to return to work.  Patient suffered an injury several weeks ago, then was out of work due to some personal/family issues.  On return to work, her work requested that she have a note clearing her to return to unrestricted duty.  Patient reports that the original injury has completely healed with no residual issues or complaints.  Patient is requesting a work note only.  No other complaints at this time.    Past Medical History:  Diagnosis Date  . ADHD (attention deficit hyperactivity disorder)   . Anxiety   . Asthma   . Bipolar 1 disorder (HCC)   . Depression   . Overdose     Patient Active Problem List   Diagnosis Date Noted  . Elevated troponin 11/07/2016  . Substance induced mood disorder (HCC) 10/31/2015  . Alcohol abuse 10/31/2015  . Cocaine abuse (HCC) 10/31/2015  . Dysthymia 10/31/2015    History reviewed. No pertinent surgical history.  Prior to Admission medications   Medication Sig Start Date End Date Taking? Authorizing Provider  doxycycline (VIBRAMYCIN) 100 MG capsule Take 1 capsule (100 mg total) by mouth 2 (two) times daily. 10/08/17   Tommi RumpsSummers, Rhonda L, PA-C  hydrOXYzine (ATARAX/VISTARIL) 25 MG tablet Take 1 tablet (25 mg total) by mouth every 6 (six) hours as needed for itching. 10/08/17   Tommi RumpsSummers, Rhonda L, PA-C    Allergies Penicillins  Family History  Problem Relation Age of Onset  . Heart murmur Mother   . Diabetes Father   . Asthma Father     Social History Social History   Tobacco Use  . Smoking status: Current Every Day Smoker    Packs/day: 0.25    Types: Cigarettes    Start  date: 01/21/2009  . Smokeless tobacco: Never Used  Substance Use Topics  . Alcohol use: Yes    Alcohol/week: 7.8 oz    Types: 4 Glasses of wine, 7 Cans of beer, 2 Shots of liquor per week  . Drug use: Yes    Types: Marijuana, Cocaine    Comment: last used 2-3 nights ago marijuana;   last used cocain about a month ago     Review of Systems  Constitutional: No fever/chills Eyes: No visual changes.  Cardiovascular: no chest pain. Respiratory: no cough. No SOB. Gastrointestinal: No abdominal pain.  No nausea, no vomiting.  Musculoskeletal: Negative for musculoskeletal pain.  Previous right hand injury with no residual complaints Skin: Negative for rash, abrasions, lacerations, ecchymosis. Neurological: Negative for headaches, focal weakness or numbness. 10-point ROS otherwise negative.  ____________________________________________   PHYSICAL EXAM:  VITAL SIGNS: ED Triage Vitals [12/01/17 1544]  Enc Vitals Group     BP (!) 145/85     Pulse Rate 96     Resp 18     Temp 98.7 F (37.1 C)     Temp Source Oral     SpO2 100 %     Weight 180 lb (81.6 kg)     Height 5\' 5"  (1.651 m)     Head Circumference      Peak Flow      Pain Score 0     Pain Loc  Pain Edu?      Excl. in GC?      Constitutional: Alert and oriented. Well appearing and in no acute distress. Eyes: Conjunctivae are normal. PERRL. EOMI. Head: Atraumatic. Neck: No stridor.    Cardiovascular: Normal rate, regular rhythm. Normal S1 and S2.  Good peripheral circulation. Respiratory: Normal respiratory effort without tachypnea or retractions. Lungs CTAB. Good air entry to the bases with no decreased or absent breath sounds. Musculoskeletal: Full range of motion to all extremities. No gross deformities appreciated.  Visualization of the right hand reveals well-healed scar to the right palm.  No surrounding erythema or edema consistent with infection.  Full range of motion to the wrist and all digits.  No  tenderness to palpation of the osseous structures of the right hand.  Sensation and capillary refill intact all digits. Neurologic:  Normal speech and language. No gross focal neurologic deficits are appreciated.  Skin:  Skin is warm, dry and intact. No rash noted. Psychiatric: Mood and affect are normal. Speech and behavior are normal. Patient exhibits appropriate insight and judgement.   ____________________________________________   LABS (all labs ordered are listed, but only abnormal results are displayed)  Labs Reviewed - No data to display ____________________________________________  EKG   ____________________________________________  RADIOLOGY   No results found.  ____________________________________________    PROCEDURES  Procedure(s) performed:    Procedures    Medications - No data to display   ____________________________________________   INITIAL IMPRESSION / ASSESSMENT AND PLAN / ED COURSE  Pertinent labs & imaging results that were available during my care of the patient were reviewed by me and considered in my medical decision making (see chart for details).  Review of the [place] CSRS was performed in accordance of the [facility] prior to dispensing any controlled drugs.      Patient's diagnosis is consistent with return to work exam.  Patient presents emergency department requesting a note to return to work.  Patient injured herself several weeks ago but was out of personal issues.  Patient returned to work, she reports that the work place new of her previous injury and needed a note to return to work.  Patient denies any complaints from previous injury.  She is requesting a return to work note.  This is given to patient.  No prescriptions.  Patient will follow primary care as needed. Patient is given ED precautions to return to the ED for any worsening or new symptoms.     ____________________________________________  FINAL CLINICAL IMPRESSION(S) / ED  DIAGNOSES  Final diagnoses:  Return to work evaluation      NEW MEDICATIONS STARTED DURING THIS VISIT:  ED Discharge Orders    None          This chart was dictated using voice recognition software/Dragon. Despite best efforts to proofread, errors can occur which can change the meaning. Any change was purely unintentional.    Racheal Patches, PA-C 12/01/17 1619    Phineas Semen, MD 12/01/17 [phone]

## 2017-12-01 NOTE — ED Notes (Signed)
Pt ambulatory upon discharge. Denied any pain. Verbalized understanding of discharge instructions and follow-up care. VSS. Skin warm and dry. A&O x4.

## 2018-03-19 ENCOUNTER — Emergency Department: Payer: BLUE CROSS/BLUE SHIELD

## 2018-03-19 ENCOUNTER — Emergency Department
Admission: EM | Admit: 2018-03-19 | Discharge: 2018-03-20 | Disposition: A | Discharge status: Home / Self Care | Payer: BLUE CROSS/BLUE SHIELD | Attending: Emergency Medicine | Admitting: Emergency Medicine

## 2018-03-19 ENCOUNTER — Other Ambulatory Visit: Payer: Self-pay

## 2018-03-19 ENCOUNTER — Encounter: Payer: Self-pay | Admitting: Emergency Medicine

## 2018-03-19 DIAGNOSIS — F17210 Nicotine dependence, cigarettes, uncomplicated: Secondary | ICD-10-CM | POA: Insufficient documentation

## 2018-03-19 DIAGNOSIS — Y939 Activity, unspecified: Secondary | ICD-10-CM | POA: Insufficient documentation

## 2018-03-19 DIAGNOSIS — Y999 Unspecified external cause status: Secondary | ICD-10-CM | POA: Diagnosis not present

## 2018-03-19 DIAGNOSIS — S0990XA Unspecified injury of head, initial encounter: Secondary | ICD-10-CM | POA: Diagnosis present

## 2018-03-19 DIAGNOSIS — Z0441 Encounter for examination and observation following alleged adult rape: Secondary | ICD-10-CM | POA: Diagnosis not present

## 2018-03-19 DIAGNOSIS — R6884 Jaw pain: Secondary | ICD-10-CM

## 2018-03-19 DIAGNOSIS — J45909 Unspecified asthma, uncomplicated: Secondary | ICD-10-CM | POA: Diagnosis not present

## 2018-03-19 DIAGNOSIS — Y9259 Other trade areas as the place of occurrence of the external cause: Secondary | ICD-10-CM | POA: Insufficient documentation

## 2018-03-19 MED ORDER — ACETAMINOPHEN 325 MG PO TABS
650.0000 mg | ORAL_TABLET | Freq: Once | ORAL | Status: DC
  Filled 2018-03-19: qty 2

## 2018-03-19 MED ORDER — ACETAMINOPHEN 325 MG PO TABS
650.0000 mg | ORAL_TABLET | Freq: Once | ORAL | Status: AC
  Administered 2018-03-19: 650 mg via ORAL
  Filled 2018-03-19: qty 2
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
## 2018-03-19 NOTE — ED Notes (Signed)
SANE RN Efraim Kaufmann reports she is finished with the patient and that patient is asking to have the doctor see her again due to pain in her shoulder and head. Dr Darnelle Catalan informed and will see pt.

## 2018-03-19 NOTE — ED Notes (Signed)
Pt to xray

## 2018-03-19 NOTE — ED Notes (Signed)
Patient offered tylenol ordered, states she does not want to take it.
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
## 2018-03-19 NOTE — ED Notes (Signed)
Pt returned from radiology.

## 2018-03-19 NOTE — Discharge Instructions (Addendum)
Use Tylenol for pain.  You can take 2 regular Tylenol 4 times a day.  Return here for worse pain fever or vomiting.  Do not take anything stronger than Tylenol for the next day.  Please follow-up with your doctor later on this week.  If you do not have a doctor try follow-up with [facility] or the [facility] or [employer] care, [place] [facility] or the [facility] clinic
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
## 2018-03-19 NOTE — ED Notes (Signed)
Melissa, RN SANE in to see pt.
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
## 2018-03-20 NOTE — SANE Note (Signed)
Follow-up Phone Call  Patient gives verbal consent for a FNE/SANE follow-up phone call in 48-72 hours: No Patient's telephone number: n/a Patient gives verbal consent to leave voicemail at the phone number listed above: No DO NOT CALL between the hours of: Declines further contact
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
## 2018-05-10 ENCOUNTER — Emergency Department: Payer: BLUE CROSS/BLUE SHIELD

## 2018-05-10 ENCOUNTER — Emergency Department
Admission: EM | Admit: 2018-05-10 | Discharge: 2018-05-10 | Disposition: A | Discharge status: Other Acute Inpatient Hospital | Payer: BLUE CROSS/BLUE SHIELD | Source: Home / Self Care | Attending: Emergency Medicine | Admitting: Emergency Medicine

## 2018-05-10 ENCOUNTER — Other Ambulatory Visit: Payer: Self-pay

## 2018-05-10 ENCOUNTER — Encounter: Payer: Self-pay | Admitting: Emergency Medicine

## 2018-05-10 ENCOUNTER — Inpatient Hospital Stay
Admission: EM | Admit: 2018-05-10 | Discharge: 2018-05-11 | DRG: 885 | Disposition: A | Discharge status: Home / Self Care | Payer: BLUE CROSS/BLUE SHIELD | Source: Intra-hospital | Attending: Psychiatry | Admitting: Psychiatry

## 2018-05-10 DIAGNOSIS — F17210 Nicotine dependence, cigarettes, uncomplicated: Secondary | ICD-10-CM

## 2018-05-10 DIAGNOSIS — Z833 Family history of diabetes mellitus: Secondary | ICD-10-CM

## 2018-05-10 DIAGNOSIS — S51812A Laceration without foreign body of left forearm, initial encounter: Secondary | ICD-10-CM | POA: Insufficient documentation

## 2018-05-10 DIAGNOSIS — R4587 Impulsiveness: Secondary | ICD-10-CM | POA: Diagnosis present

## 2018-05-10 DIAGNOSIS — D72829 Elevated white blood cell count, unspecified: Secondary | ICD-10-CM | POA: Diagnosis present

## 2018-05-10 DIAGNOSIS — Z825 Family history of asthma and other chronic lower respiratory diseases: Secondary | ICD-10-CM

## 2018-05-10 DIAGNOSIS — X781XXA Intentional self-harm by knife, initial encounter: Secondary | ICD-10-CM

## 2018-05-10 DIAGNOSIS — S61519D Laceration without foreign body of unspecified wrist, subsequent encounter: Secondary | ICD-10-CM | POA: Diagnosis not present

## 2018-05-10 DIAGNOSIS — Y903 Blood alcohol level of 60-79 mg/100 ml: Secondary | ICD-10-CM | POA: Diagnosis present

## 2018-05-10 DIAGNOSIS — J45909 Unspecified asthma, uncomplicated: Secondary | ICD-10-CM | POA: Diagnosis present

## 2018-05-10 DIAGNOSIS — F1220 Cannabis dependence, uncomplicated: Secondary | ICD-10-CM | POA: Diagnosis present

## 2018-05-10 DIAGNOSIS — Y9389 Activity, other specified: Secondary | ICD-10-CM

## 2018-05-10 DIAGNOSIS — F319 Bipolar disorder, unspecified: Secondary | ICD-10-CM | POA: Insufficient documentation

## 2018-05-10 DIAGNOSIS — F17200 Nicotine dependence, unspecified, uncomplicated: Secondary | ICD-10-CM | POA: Diagnosis present

## 2018-05-10 DIAGNOSIS — Z880 Allergy status to penicillin: Secondary | ICD-10-CM

## 2018-05-10 DIAGNOSIS — Z23 Encounter for immunization: Secondary | ICD-10-CM | POA: Insufficient documentation

## 2018-05-10 DIAGNOSIS — F1020 Alcohol dependence, uncomplicated: Secondary | ICD-10-CM | POA: Diagnosis present

## 2018-05-10 DIAGNOSIS — F909 Attention-deficit hyperactivity disorder, unspecified type: Secondary | ICD-10-CM | POA: Diagnosis present

## 2018-05-10 DIAGNOSIS — S022XXA Fracture of nasal bones, initial encounter for closed fracture: Secondary | ICD-10-CM

## 2018-05-10 DIAGNOSIS — F332 Major depressive disorder, recurrent severe without psychotic features: Secondary | ICD-10-CM | POA: Diagnosis present

## 2018-05-10 DIAGNOSIS — F419 Anxiety disorder, unspecified: Secondary | ICD-10-CM | POA: Diagnosis present

## 2018-05-10 DIAGNOSIS — Z915 Personal history of self-harm: Secondary | ICD-10-CM | POA: Diagnosis not present

## 2018-05-10 DIAGNOSIS — Y929 Unspecified place or not applicable: Secondary | ICD-10-CM

## 2018-05-10 DIAGNOSIS — Y999 Unspecified external cause status: Secondary | ICD-10-CM

## 2018-05-10 DIAGNOSIS — F1420 Cocaine dependence, uncomplicated: Secondary | ICD-10-CM | POA: Diagnosis present

## 2018-05-10 DIAGNOSIS — F10920 Alcohol use, unspecified with intoxication, uncomplicated: Secondary | ICD-10-CM

## 2018-05-10 DIAGNOSIS — X789XXA Intentional self-harm by unspecified sharp object, initial encounter: Secondary | ICD-10-CM | POA: Diagnosis present

## 2018-05-10 DIAGNOSIS — Z792 Long term (current) use of antibiotics: Secondary | ICD-10-CM

## 2018-05-10 DIAGNOSIS — M25569 Pain in unspecified knee: Secondary | ICD-10-CM | POA: Diagnosis present

## 2018-05-10 DIAGNOSIS — F39 Unspecified mood [affective] disorder: Secondary | ICD-10-CM | POA: Insufficient documentation

## 2018-05-10 DIAGNOSIS — Z7289 Other problems related to lifestyle: Secondary | ICD-10-CM

## 2018-05-10 LAB — CBC WITH DIFFERENTIAL/PLATELET
ABS IMMATURE GRANULOCYTES: 0.1 10*3/uL — AB (ref 0.00–0.07)
BASOS PCT: 0 %
Basophils Absolute: 0.1 10*3/uL (ref 0.0–0.1)
EOS PCT: 1 %
Eosinophils Absolute: 0.2 10*3/uL (ref 0.0–0.5)
HCT: 36 % (ref 36.0–46.0)
Hemoglobin: 12.4 g/dL (ref 12.0–15.0)
Immature Granulocytes: 1 %
Lymphocytes Relative: 14 %
Lymphs Abs: 2.6 10*3/uL (ref 0.7–4.0)
MCH: 33.6 pg (ref 26.0–34.0)
MCHC: 34.4 g/dL (ref 30.0–36.0)
MCV: 97.6 fL (ref 80.0–100.0)
MONO ABS: 1.3 10*3/uL — AB (ref 0.1–1.0)
Monocytes Relative: 7 %
NEUTROS ABS: 14 10*3/uL — AB (ref 1.7–7.7)
Neutrophils Relative %: 77 %
PLATELETS: 335 10*3/uL (ref 150–400)
RBC: 3.69 MIL/uL — ABNORMAL LOW (ref 3.87–5.11)
RDW: 15.3 % (ref 11.5–15.5)
WBC: 18.3 10*3/uL — AB (ref 4.0–10.5)
nRBC: 0 % (ref 0.0–0.2)

## 2018-05-10 LAB — BASIC METABOLIC PANEL
Anion gap: 8 (ref 5–15)
BUN: 7 mg/dL (ref 6–20)
CHLORIDE: 106 mmol/L (ref 98–111)
CO2: 26 mmol/L (ref 22–32)
Calcium: 8.9 mg/dL (ref 8.9–10.3)
Creatinine, Ser: 0.48 mg/dL (ref 0.44–1.00)
GFR calc Af Amer: >60 mL/min (ref >60)
GLUCOSE: 98 mg/dL (ref 70–99)
POTASSIUM: 3.4 mmol/L — AB (ref 3.5–5.1)
Sodium: 140 mmol/L (ref 135–145)

## 2018-05-10 LAB — ETHANOL: Alcohol, Ethyl (B): 66 mg/dL — ABNORMAL HIGH (ref <10)

## 2018-05-10 MED ORDER — CHLORDIAZEPOXIDE HCL 25 MG PO CAPS: 25.0000 mg | ORAL_CAPSULE | Freq: Four times a day (QID) | ORAL | Status: DC

## 2018-05-10 MED ORDER — HYDROXYZINE HCL 25 MG PO TABS: 25.0000 mg | ORAL_TABLET | Freq: Four times a day (QID) | ORAL | Status: DC | PRN

## 2018-05-10 MED ORDER — ALUM & MAG HYDROXIDE-SIMETH 200-200-20 MG/5ML PO SUSP: 30.0000 mL | ORAL | Status: DC | PRN

## 2018-05-10 MED ORDER — OXYCODONE-ACETAMINOPHEN 5-325 MG PO TABS
2.0000 | ORAL_TABLET | Freq: Once | ORAL | Status: AC
  Administered 2018-05-10: 2 via ORAL
  Filled 2018-05-10: qty 2

## 2018-05-10 MED ORDER — ACETAMINOPHEN 325 MG PO TABS
650.0000 mg | ORAL_TABLET | Freq: Four times a day (QID) | ORAL | Status: DC | PRN
  Administered 2018-05-10 – 2018-05-11 (×2): 650 mg via ORAL
  Filled 2018-05-10 (×2): qty 2

## 2018-05-10 MED ORDER — DOXYCYCLINE HYCLATE 100 MG PO TABS
100.0000 mg | ORAL_TABLET | Freq: Two times a day (BID) | ORAL | Status: DC
  Administered 2018-05-10 – 2018-05-11 (×2): 100 mg via ORAL
  Filled 2018-05-10 (×3): qty 1

## 2018-05-10 MED ORDER — MAGNESIUM HYDROXIDE 400 MG/5ML PO SUSP: 30.0000 mL | Freq: Every day | ORAL | Status: DC | PRN

## 2018-05-10 MED ORDER — NICOTINE 21 MG/24HR TD PT24
21.0000 mg | MEDICATED_PATCH | Freq: Every day | TRANSDERMAL | Status: DC
  Administered 2018-05-11: 21 mg via TRANSDERMAL
  Filled 2018-05-10: qty 1

## 2018-05-10 MED ORDER — TRAZODONE HCL 100 MG PO TABS
100.0000 mg | ORAL_TABLET | Freq: Every evening | ORAL | Status: DC | PRN
  Administered 2018-05-10: 100 mg via ORAL
  Filled 2018-05-10: qty 1

## 2018-05-10 MED ORDER — OXYMETAZOLINE HCL 0.05 % NA SOLN
1.0000 | Freq: Once | NASAL | Status: AC
  Administered 2018-05-10: 1 via NASAL
  Filled 2018-05-10: qty 15

## 2018-05-10 MED ORDER — ACETAMINOPHEN 325 MG PO TABS
650.0000 mg | ORAL_TABLET | Freq: Once | ORAL | Status: AC
  Administered 2018-05-10: 650 mg via ORAL
  Filled 2018-05-10: qty 2

## 2018-05-10 MED ORDER — TETANUS-DIPHTH-ACELL PERTUSSIS 5-2.5-18.5 LF-MCG/0.5 IM SUSP
0.5000 mL | Freq: Once | INTRAMUSCULAR | Status: AC
  Administered 2018-05-10: 0.5 mL via INTRAMUSCULAR
  Filled 2018-05-10: qty 0.5

## 2018-05-10 NOTE — Plan of Care (Signed)
Pt. Participation in unit activities good, out visible in the milieu appropriately. Pt. Complaint with medications. Pt. Denies si/hi, verbally is able to contract for safety. Pt. Reports she is able to remain safe while on the unit.    Problem: Activity: Goal: Interest or engagement in leisure activities will improve Outcome: Progressing   Problem: Health Behavior/Discharge Planning: Goal: Compliance with therapeutic regimen will improve Outcome: Progressing   Problem: Safety: Goal: Ability to disclose and discuss suicidal ideas will improve Outcome: Progressing   Problem: Safety: Goal: Ability to remain free from injury will improve Outcome: Progressing

## 2018-05-10 NOTE — ED Notes (Signed)
Afrin ordered for patient 1 spray to each nare as directed.

## 2018-05-10 NOTE — ED Notes (Signed)
Patient assigned to appropriate care area   Introduced self to pt  Patient oriented to unit/care area: Informed that, for their safety, care areas are designed for safety and visiting and phone hours explained to patient. Patient verbalizes understanding, and verbal contract for safety obtained Environment secured    Patient denies SI/HI/AVH 
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
## 2018-05-10 NOTE — ED Notes (Signed)
Patient has superficial cuts on wrist and a busted lip

## 2018-05-10 NOTE — ED Notes (Signed)
[facility], patient received papers. Patient got belongings and verbalized he has received all of his belongings. Patient appropriate and cooperative, Denies SI/HI AVH. NAD noted.

## 2018-05-10 NOTE — ED Notes (Addendum)
Patient belonging placed in bag, patient name label placed on bag.  1. 1 pair grey sweat pants 2. 1 pair of multi color sneakers. 3. 1 t-shirt  4. 1- black  lighter  5. 1 green jacket.  6. 1 cell phone 7. 1 cell phone charger  Patients belongings given to quad nurse.

## 2018-05-10 NOTE — BH Assessment (Signed)
[facility] by Dr. Jennet MaduroPucilowska.  Attending Physician will be Dr. Jennet MaduroPucilowska.   Patient [facility] room [facility] T'Yawn.   Intake Paper Work has been signed and placed on patient chart.  ER staff is aware of the admission:  Misty StanleyLisa, ER Secretary    Dr. Shaune PollackLord, ER MD   Geralynn OchsJadeka, Patient's Nurse   Ethelene BrownsAnthony, Patient Access.

## 2018-05-10 NOTE — ED Notes (Signed)
Patient talking to SOC 

## 2018-05-10 NOTE — ED Notes (Signed)
SOC  CALLED  PER  DR  Shaune PollackLORD  MD

## 2018-05-10 NOTE — ED Notes (Signed)
p-atient moved to quad area.

## 2018-05-10 NOTE — ED Notes (Signed)
IVC/SOC completed report on chart
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
## 2018-05-10 NOTE — ED Notes (Signed)
Patient c/o of pain 9/10 generalized body / left knee pain. Po meds given will re-eval pain.
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
## 2018-05-10 NOTE — ED Notes (Signed)
Patient refused to change out Dr. Mayford KnifeWilliams aware. Patient agreed to have security wand her down for weapons. Clothing checked for weapons.
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
## 2018-05-10 NOTE — ED Notes (Signed)
IVC/Consult ordered ?
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
## 2018-05-10 NOTE — BH Assessment (Signed)
Per Dr. Jennet MaduroPucilowska, [facility] have [facility] submitted.

## 2018-05-10 NOTE — Plan of Care (Signed)
Patient newly admitted to unit, goals not yet met.
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
## 2018-05-10 NOTE — Progress Notes (Signed)
1:1 Observation Note   1900 Pt. observed in the day room with 1:1 present for safety and security. No distress noted.  2000 Pt. observed in the day room with 1:1 present for safety and security. No distress noted.  2100 Pt. observed in her bedroom sleeping. No distress noted.  2200 Pt. observed in her bedroom sleeping. No distress noted. 2300 Pt. observed in her bedroom sleeping. No distress noted. 0000 Pt. observed in her bedroom sleeping. No distress noted. 0100 Pt. observed in her bedroom sleeping. No distress noted. 0200 Pt. observed in her bedroom sleeping. No distress noted. 0300 Pt. observed in her bedroom sleeping. No distress noted. 0400 Pt. observed in her bedroom sleeping. No distress noted. 0500 Pt. observed in her bedroom sleeping. No distress noted. 0600 Pt. observed in her bedroom sleeping. No distress noted.

## 2018-05-11 DIAGNOSIS — F332 Major depressive disorder, recurrent severe without psychotic features: Principal | ICD-10-CM

## 2018-05-11 LAB — LIPID PANEL
Cholesterol: 143 mg/dL (ref 0–200)
HDL: 42 mg/dL (ref >40)
LDL Cholesterol: 87 mg/dL (ref 0–99)
Total CHOL/HDL Ratio: 3.4 RATIO
Triglycerides: 71 mg/dL (ref <150)
VLDL: 14 mg/dL (ref 0–40)

## 2018-05-11 LAB — CBC
HCT: 36.6 % (ref 36.0–46.0)
HEMOGLOBIN: 12.3 g/dL (ref 12.0–15.0)
MCH: 33.6 pg (ref 26.0–34.0)
MCHC: 33.6 g/dL (ref 30.0–36.0)
MCV: 100 fL (ref 80.0–100.0)
NRBC: 0 % (ref 0.0–0.2)
Platelets: 334 10*3/uL (ref 150–400)
RBC: 3.66 MIL/uL — ABNORMAL LOW (ref 3.87–5.11)
RDW: 15.1 % (ref 11.5–15.5)
WBC: 15 10*3/uL — AB (ref 4.0–10.5)

## 2018-05-11 LAB — URINE DRUG SCREEN, QUALITATIVE (ARMC ONLY)
AMPHETAMINES, UR SCREEN: NOT DETECTED
Barbiturates, Ur Screen: NOT DETECTED
Benzodiazepine, Ur Scrn: NOT DETECTED
Cannabinoid 50 Ng, Ur ~~LOC~~: NOT DETECTED
Cocaine Metabolite,Ur ~~LOC~~: POSITIVE — AB
MDMA (ECSTASY) UR SCREEN: NOT DETECTED
Methadone Scn, Ur: NOT DETECTED
Opiate, Ur Screen: NOT DETECTED
Phencyclidine (PCP) Ur S: NOT DETECTED
TRICYCLIC, UR SCREEN: NOT DETECTED

## 2018-05-11 LAB — HEMOGLOBIN A1C
HEMOGLOBIN A1C: 5.1 % (ref 4.8–5.6)
MEAN PLASMA GLUCOSE: 99.67 mg/dL

## 2018-05-11 LAB — TSH: TSH: 2.35 u[IU]/mL (ref 0.350–4.500)

## 2018-05-11 MED ORDER — DOXYCYCLINE HYCLATE 100 MG PO TABS: 100.0000 mg | ORAL_TABLET | Freq: Two times a day (BID) | ORAL | 0 refills | Status: DC

## 2018-05-11 NOTE — Progress Notes (Signed)
  [facility] Dis[facility] :  Will you be returning to the same living situation after discharge:  Yes,  will be living at father's house At discharge, do you have transportation home?: Yes,  father will pick her up Do you have the ability to pay for your medications: Yes,  Insurance, income  Release of information consent forms completed and in the chart;  Patient's signature needed at discharge.  Patient to Follow up at: Follow-up Information    [facility] Go on 05/16/2018.   Why:  Please [facility] at [facility] on Monday, May 16, 2018 at 7:15am. Please bring photo ID, insurance card, medications. Thank you. Contact information: [address] [place] [id number] [phone]           Next level of care provider has access to [facility] Link:no  Safety Planning and Suicide Prevention discussed: Yes,  Koleen DistanceKevin Baca, father  Have you used any form of tobacco in the last 30 days? (Cigarettes, Smokeless Tobacco, Cigars, and/or Pipes): Yes  Has patient been referred to the Quitline?: Patient refused referral  Patient has been referred for addiction treatment: Pt. refused referral  Suzan SlickDARREN T Ozzie Knobel, LCSW 05/11/2018, 11:18 AM

## 2018-05-11 NOTE — Progress Notes (Signed)
Call placed to MD on call to review patient case. Patient 1:1 observation discontinued per MD orders.
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
## 2018-05-11 NOTE — [facility] Suicide Risk Assessment (Signed)
[facility]   Principal Problem: Major depressive disorder, recurrent severe without psychotic features (HCC) Discharge Diagnoses: Principal Problem:   Major depressive disorder, recurrent severe without psychotic features (HCC) Active Problems:   Alcohol use disorder, moderate, dependence (HCC)   Cocaine use disorder, moderate, dependence (HCC)   Cannabis use disorder, moderate, dependence (HCC)   Tobacco use disorder   Suicide attempt by cutting of wrist (HCC)   Total Time spent with patient: 1 hour  Musculoskeletal: Strength & Muscle Tone: within normal limits Gait & Station: unsteady Patient leans: N/A  Psychiatric Specialty Exam: ROS  Blood pressure (!) 105/53, pulse 93, temperature 99.7 F (37.6 C), temperature source Oral, resp. rate 18, height 5\' 5"  (1.651 m), weight 81.6 kg, last menstrual period 05/10/2018, SpO2 97 %.Body mass index is 29.95 kg/m.  General Appearance: Casual  Eye Contact::  Good  Speech:  Clear and Coherent409  Volume:  Normal  Mood:  Euthymic  Affect:  Appropriate  Thought Process:  Goal Directed and Descriptions of Associations: Intact  Orientation:  Full (Time, Place, and Person)  Thought Content:  WDL  Suicidal Thoughts:  No  Homicidal Thoughts:  No  Memory:  Immediate;   Fair Recent;   Fair Remote;   Fair  Judgement:  Poor  Insight:  Lacking  Psychomotor Activity:  Normal  Concentration:  Fair  Recall:  Fis[employer]Fair  Fund of Knowledge:Fair  Language: Fair  Akathisia:  No  Handed:  Right  AIMS (if indicated):     Assets:  Communication Skills Desire for Improvement Financial Resources/Insurance Housing Intimacy Physical Health Resilience Social Support Transportation Vocational/Educational  Sleep:  Number of Hours: 8.3  Cognition: WNL  ADL's:  Intact   Mental Status Per Nursing Assessment::   On Admission:  Suicidal ideation indicated by others  Demographic Factors:  Gay, lesbian, or bisexual  orientation  Loss Factors: NA  Historical Factors: Impulsivity  Risk Reduction Factors:   Sense of responsibility to family, Employed, Living with another person, especially a relative and Positive social support  Continued Clinical Symptoms:  Depression:   Comorbid alcohol abuse/dependence Impulsivity Alcohol/Substance Abuse/Dependencies  Cognitive Features That Contribute To Risk:  None    Suicide Risk:  Minimal: No identifiable suicidal ideation.  Patients presenting with no risk factors but with morbid ruminations; may be classified as minimal risk based on the severity of the depressive symptoms  Follow-up Information    [facility] Go on 05/16/2018.   Why:  [facility] Monday, May 16, 2018 at 7:15am. Please bring photo ID, insurance card, medications. Thank you. Contact information: [address] [place] [id number] [phone]           Plan Of Care/Follow-up recommendations:  Activity:  as tolerated Diet:  low sodium heart healthy Other:  keep follow up appointments  Kristine LineaJolanta Galia Rahm, MD 05/11/2018, 11:09 AM

## 2018-05-11 NOTE — Progress Notes (Signed)
Patient ID: Jeanette Zimmerman, female   DOB: Nov 06, 1995, 22 y.o.   MRN: [id number]   Discharge Note:  Patient denies SI/HI/AVH at this time. Discharge instructions, AVS, transition record and prescriptions gone over with patient. Patient agrees to comply with medication management, follow-up visit, and outpatient therapy. Patient belongings returned to patient. Patient questions and concerns addressed and answered. Patient wheelchair bound off unit. Patient discharged to home with Father.

## 2018-05-11 NOTE — [facility] Suicide Risk Assessment (Signed)
[facility] INPATIENT:  Family/Significant Other Suicide Prevention Education  Suicide Prevention Education:  Education Completed; Koleen DistanceKevin Christenbury, father [phone] has been identified by the patient as the family member/significant other with whom the patient will be residing, and identified as the person(s) who will aid the patient in the event of a mental health crisis (suicidal ideations/suicide attempt).  With written consent from the patient, the family member/significant other has been provided the following suicide prevention education, prior to the and/or following the discharge of the patient.  The suicide prevention education provided includes the following:  Suicide risk factors  Suicide prevention and interventions  National Suicide Hotline telephone number  [facility] assessment telephone number  [facility] Emergency Assistance 911  [facility] and/or Residential Mobile Crisis Unit telephone number  Request made of family/significant other to:  Remove weapons (e.g., guns, rifles, knives), all items previously/currently identified as safety concern.    Remove drugs/medications (over-the-counter, prescriptions, illicit drugs), all items previously/currently identified as a safety concern.  The family member/significant other verbalizes understanding of the suicide prevention education information provided.  The family member/significant other agrees to remove the items of safety concern listed above. Mr. Mitzie NaBigelow denies pt having access to guns or weapons in the home and says pt will be living with him. He does not report any reservation about pt returning to the home. Mr. Mitzie NaBigelow encouraged to call 911 or take pt to nearest emergency department if she needs further medical attention.  Keyonda Bickle T Kolina Kube 05/11/2018, 11:15 AM
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
## 2018-05-11 NOTE — Progress Notes (Signed)
Recreation Therapy Notes  Date: 05/11/2018  Time: 9:30 am   Location: Craft room   Behavioral response: N/A   Intervention Topic: Problem Solving  Discussion/Intervention: Patient did not attend group.   Clinical Observations/Feedback:  Patient did not attend group.   Lahoma Constantin LRT/CTRS        Tamila Gaulin 05/11/2018 11:10 AM

## 2018-05-11 NOTE — Tx Team (Signed)
Interdisciplinary Treatment and Diagnostic Plan Update  05/11/2018 Time of Session: 1030am AHLEY Zimmerman MRN: [id number]  Principal Diagnosis: Major depressive disorder, recurrent severe without psychotic features (HCC)  Secondary Diagnoses: Principal Problem:   Major depressive disorder, recurrent severe without psychotic features (HCC) Active Problems:   Alcohol use disorder, moderate, dependence (HCC)   Cocaine use disorder, moderate, dependence (HCC)   Cannabis use disorder, moderate, dependence (HCC)   Tobacco use disorder   Suicide attempt by cutting of wrist (HCC)   Current Medications:  Current Facility-Administered Medications  Medication Dose Route Frequency Provider Last Rate Last Dose  . acetaminophen (TYLENOL) tablet 650 mg  650 mg Oral Q6H PRN Pucilowska, Jolanta B, MD   650 mg at 05/11/18 0759  . alum & mag hydroxide-simeth (MAALOX/MYLANTA) 200-200-20 MG/5ML suspension 30 mL  30 mL Oral Q4H PRN Pucilowska, Jolanta B, MD      . doxycycline (VIBRA-TABS) tablet 100 mg  100 mg Oral BID WC Pucilowska, Jolanta B, MD   100 mg at 05/11/18 0759  . hydrOXYzine (ATARAX/VISTARIL) tablet 25 mg  25 mg Oral Q6H PRN Pucilowska, Jolanta B, MD      . magnesium hydroxide (MILK OF MAGNESIA) suspension 30 mL  30 mL Oral Daily PRN Pucilowska, Jolanta B, MD      . nicotine (NICODERM CQ - dosed in mg/24 hours) patch 21 mg  21 mg Transdermal Q0600 Pucilowska, Jolanta B, MD   21 mg at 05/11/18 0759  . traZODone (DESYREL) tablet 100 mg  100 mg Oral QHS PRN Pucilowska, Jolanta B, MD   100 mg at 05/10/18 2127   PTA Medications: Medications Prior to Admission  Medication Sig Dispense Refill Last Dose  . doxycycline (VIBRAMYCIN) 100 MG capsule Take 1 capsule (100 mg total) by mouth 2 (two) times daily. 14 capsule 0   . hydrOXYzine (ATARAX/VISTARIL) 25 MG tablet Take 1 tablet (25 mg total) by mouth every 6 (six) hours as needed for itching. 30 tablet 0     Patient Stressors: Marital or family  conflict Substance abuse  Patient Strengths: Active sense of humor Motivation for treatment/growth Supportive family/friends  Treatment Modalities: Medication Management, Group therapy, Case management,  1 to 1 session with clinician, Psychoeducation, Recreational therapy.   Physician Treatment Plan for Primary Diagnosis: Major depressive disorder, recurrent severe without psychotic features (HCC) Long Term Goal(s): Improvement in symptoms so as ready for discharge Improvement in symptoms so as ready for discharge   Short Term Goals: Ability to identify changes in lifestyle to reduce recurrence of condition will improve Ability to verbalize feelings will improve Ability to disclose and discuss suicidal ideas Ability to demonstrate self-control will improve Ability to identify and develop effective coping behaviors will improve Ability to maintain clinical measurements within normal limits will improve Compliance with prescribed medications will improve Ability to identify triggers associated with substance abuse/mental health issues will improve Ability to identify changes in lifestyle to reduce recurrence of condition will improve Ability to demonstrate self-control will improve Ability to identify triggers associated with substance abuse/mental health issues will improve  Medication Management: Evaluate patient's response, side effects, and tolerance of medication regimen.  Therapeutic Interventions: 1 to 1 sessions, Unit Group sessions and Medication administration.  Evaluation of Outcomes: Progressing  Physician Treatment Plan for Secondary Diagnosis: Principal Problem:   Major depressive disorder, recurrent severe without psychotic features (HCC) Active Problems:   Alcohol use disorder, moderate, dependence (HCC)   Cocaine use disorder, moderate, dependence (HCC)   Cannabis use disorder, moderate,  dependence (HCC)   Tobacco use disorder   Suicide attempt by cutting of  wrist (HCC)  Long Term Goal(s): Improvement in symptoms so as ready for discharge Improvement in symptoms so as ready for discharge   Short Term Goals: Ability to identify changes in lifestyle to reduce recurrence of condition will improve Ability to verbalize feelings will improve Ability to disclose and discuss suicidal ideas Ability to demonstrate self-control will improve Ability to identify and develop effective coping behaviors will improve Ability to maintain clinical measurements within normal limits will improve Compliance with prescribed medications will improve Ability to identify triggers associated with substance abuse/mental health issues will improve Ability to identify changes in lifestyle to reduce recurrence of condition will improve Ability to demonstrate self-control will improve Ability to identify triggers associated with substance abuse/mental health issues will improve     Medication Management: Evaluate patient's response, side effects, and tolerance of medication regimen.  Therapeutic Interventions: 1 to 1 sessions, Unit Group sessions and Medication administration.  Evaluation of Outcomes: Progressing   RN Treatment Plan for Primary Diagnosis: Major depressive disorder, recurrent severe without psychotic features (HCC) Long Term Goal(s): Knowledge of disease and therapeutic regimen to maintain health will improve  Short Term Goals: Ability to remain free from injury will improve, Ability to verbalize feelings will improve, Ability to identify and develop effective coping behaviors will improve and Compliance with prescribed medications will improve  Medication Management: RN will administer medications as ordered by provider, will assess and evaluate patient's response and provide education to patient for prescribed medication. RN will report any adverse and/or side effects to prescribing provider.  Therapeutic Interventions: 1 on 1 counseling sessions,  Psychoeducation, Medication administration, Evaluate responses to treatment, Monitor vital signs and CBGs as ordered, Perform/monitor CIWA, COWS, AIMS and Fall Risk screenings as ordered, Perform wound care treatments as ordered.  Evaluation of Outcomes: Progressing   LCSW Treatment Plan for Primary Diagnosis: Major depressive disorder, recurrent severe without psychotic features (HCC) Long Term Goal(s): Safe transition to appropriate next level of care at discharge, Engage patient in therapeutic group addressing interpersonal concerns.  Short Term Goals: Engage patient in aftercare planning with referrals and resources, Increase emotional regulation and Increase skills for wellness and recovery  Therapeutic Interventions: Assess for all discharge needs, 1 to 1 time with Social worker, Explore available resources and support systems, Assess for adequacy in community support network, Educate family and significant other(s) on suicide prevention, Complete Psychosocial Assessment, Interpersonal group therapy.  Evaluation of Outcomes: Progressing   Progress in Treatment: Attending groups: No. Participating in groups: No. Taking medication as prescribed: Yes. Toleration medication: Yes. Family/Significant other contact made: Yes, individual(s) contacted:  Koleen DistanceKevin Balicki, father Patient understands diagnosis: Yes. Discussing patient identified problems/goals with staff: Yes. Medical problems stabilized or resolved: Yes. Denies suicidal/homicidal ideation: Yes. Issues/concerns per patient self-inventory: No. Other: NA  New problem(s) identified: No, Describe:  None reported  New Short Term/Long Term Goal(s): "Not to get into anymore fights"  Patient Goals:  "Not to get into anymore fights"  Discharge Plan or Barriers: Pt will return home and follow up with outpatient treatment  Reason for Continuation of Hospitalization: Medication stabilization  Estimated Length of Stay: Discharging on  05/11/18  Attendees: Patient:Jeanette Zimmerman 05/11/2018 11:21 AM  Physician: Lilia ProJolenta, Pucilowska, MD 05/11/2018 11:21 AM  Nursing: Doyce Paraemetria Ravenell, RN 05/11/2018 11:21 AM  RN Care Manager: 05/11/2018 11:21 AM  Social Worker: Lowella Dandyarren Jereline Ticer, LCSW 05/11/2018 11:21 AM  Recreational Therapist:  05/11/2018 11:21 AM  Other:  05/11/2018 11:21 AM  Other:  05/11/2018 11:21 AM  Other: 05/11/2018 11:21 AM    Scribe for Treatment Team: Suzan Slick, LCSW 05/11/2018 11:21 AM

## 2018-05-11 NOTE — BHH Counselor (Signed)
Pt is being discharged today, PSA not required.

## 2018-05-11 NOTE — Care Management (Signed)
[place] [facility].

## 2018-05-11 NOTE — Progress Notes (Signed)
D- Patient alert and oriented. Patient presents in a pleasant mood on assessment stating that she slept ok last night and had some complaints of pain in her face and leg, rating her leg a "10/10" and her face a "6/10". Patient did request pain medication from this writer. Patient denies SI, HI, AVH, at this time. Patient also denies depression and anxiety. Patient's goal for today is to "get out".  A- Scheduled medications administered to patient, per MD orders. Support and encouragement provided.  Routine safety checks conducted every 15 minutes.  Patient informed to notify staff with problems or concerns.  R- No adverse drug reactions noted. Patient contracts for safety at this time. Patient compliant with medications and treatment plan. Patient receptive, calm, and cooperative. Patient interacts well with others on the unit.  Patient remains safe at this time.

## 2018-10-05 ENCOUNTER — Telehealth: Payer: Self-pay | Admitting: Obstetrics & Gynecology

## 2018-10-05 NOTE — Telephone Encounter (Signed)
[facility] referring for Abnormal pap HPV +. Voicemail box not set up unable to leave message

## 2018-10-06 ENCOUNTER — Ambulatory Visit: Payer: Self-pay | Admitting: Surgery

## 2018-10-11 NOTE — Telephone Encounter (Signed)
Unable to reach patient to schedule appointment due to voicemail box not set up

## 2018-10-14 NOTE — Telephone Encounter (Signed)
Voicemail box not set up. Unable to leave message for patient to call back

## 2018-10-17 NOTE — Telephone Encounter (Signed)
Spoke with Rinaldo Cloud with [facility] about multiple attempts to reach patient to schedule was unsuccessful. Holding paper records for 3 months

## 2019-03-07 ENCOUNTER — Ambulatory Visit: Payer: Self-pay | Admitting: Surgery

## 2019-03-08 ENCOUNTER — Encounter: Payer: Self-pay | Admitting: Surgery

## 2019-03-08 ENCOUNTER — Other Ambulatory Visit: Payer: Self-pay

## 2019-03-08 ENCOUNTER — Ambulatory Visit: Payer: BC Managed Care – PPO | Admitting: Surgery

## 2019-03-08 VITALS — BP 135/90 | HR 88 | Temp 97.9°F | Resp 14 | Ht 65.0 in | Wt 191.6 lb

## 2019-03-08 DIAGNOSIS — N611 Abscess of the breast and nipple: Secondary | ICD-10-CM | POA: Diagnosis not present

## 2019-03-08 MED ORDER — AMOXICILLIN-POT CLAVULANATE 875-125 MG PO TABS: 1.0000 | ORAL_TABLET | Freq: Two times a day (BID) | ORAL | 0 refills | Status: AC

## 2019-03-08 NOTE — Progress Notes (Signed)
03/08/2019  Reason for Visit:  Recurrent left breast abscess  Referring Provider:  Yves Dill, MD  History of Present Illness: Jeanette Zimmerman is a 23 y.o. female presenting for evaluation of recurrent left breast abscess.  She reports that she's had multiple areas of bilateral breasts that would start as a boil and then burst with purulent drainage, and would heal and get better, only to flare up again in the future.  She currently has one area of the left medial breast that recently flared up and drained.  This is the same site as a previous abscess she had.  She reports the fluid was foul smelling.  She has had this in other areas of her left breast as well as right breast.  She reports that she also has one one in the axilla, but none in the groin.  She saw her PCP and was started on Bactrim.  However, she's now also developed a body rash, particularly in the torso.  Denies any fevers, chills, chest pain, shortness of breath.  The Bactrim has helped with the abscess and is no longer tender and no longer draining.    Past Medical History: Past Medical History:  Diagnosis Date  . ADHD (attention deficit hyperactivity disorder)   . Anxiety   . Asthma   . Bipolar 1 disorder (HCC)   . Depression   . Overdose      Past Surgical History: History reviewed. No pertinent surgical history.  Home Medications: Prior to Admission medications   Medication Sig Start Date End Date Taking? Authorizing Provider  acetaminophen (TYLENOL) 325 MG tablet Take 650 mg by mouth every 6 (six) hours as needed.   Yes [provider]  doxycycline (VIBRA-TABS) 100 MG tablet Take 1 tablet (100 mg total) by mouth 2 (two) times daily with a meal. 05/11/18  Yes Pucilowska, Jolanta B, MD  amoxicillin-clavulanate (AUGMENTIN) 875-125 MG tablet Take 1 tablet by mouth 2 (two) times daily for 10 days. 03/08/19 03/18/19  Henrene Dodge, MD    Allergies: Allergies  Allergen Reactions  . Bactrim Ds  [Sulfamethoxazole-Trimethoprim]     rash  . Sulfur     rash    Social History:  reports that she has been smoking cigarettes. She started smoking about 10 years ago. She has been smoking about 1.00 pack per day. She has never used smokeless tobacco. She reports current alcohol use of about 13.0 standard drinks of alcohol per week. She reports previous drug use. Drugs: Marijuana and Cocaine.   Family History: Family History  Problem Relation Age of Onset  . Heart murmur Mother   . Diabetes Father   . Asthma Father     Review of Systems: Review of Systems  Constitutional: Negative for chills and fever.  HENT: Negative for hearing loss.   Eyes: Negative for blurred vision.  Respiratory: Negative for shortness of breath.   Cardiovascular: Negative for chest pain.  Gastrointestinal: Negative for abdominal pain, nausea and vomiting.  Genitourinary: Negative for dysuria.  Musculoskeletal: Negative for myalgias.  Skin: Positive for rash.       Left breast abscess  Neurological: Negative for dizziness.  Psychiatric/Behavioral: Negative for depression.    Physical Exam BP 135/90   Pulse 88   Temp 97.9 F (36.6 C) (Temporal)   Resp 14   Ht 5\' 5"  (1.651 m)   Wt 191 lb 9.6 oz (86.9 kg)   SpO2 98%   BMI 31.88 kg/m  CONSTITUTIONAL: No acute distress HEENT:  Normocephalic,  atraumatic, extraocular motion intact. NECK: Trachea is midline, and there is no jugular venous distension.  RESPIRATORY:  Lungs are clear, and breath sounds are equal bilaterally. Normal respiratory effort without pathologic use of accessory muscles. CARDIOVASCULAR: Heart is regular without murmurs, gallops, or rubs. GI: The abdomen is soft, non-distended, non-tender.  MUSCULOSKELETAL:  Normal muscle strength and tone in all four extremities.  No peripheral edema or cyanosis. SKIN:  Patient has a macular rash over the torso, extending to the upper arms just distal to the shoulder.  There is a 3 cm area in the  medial left breast consistent with prior abscess.  It is healing and there is no active drainage at this point, without any induration but there is still erythema.  There are other areas in the mid and lateral inferior left breast that are scars from prior abscesses, as well as scars in the right breast from prior abscesses.  NEUROLOGIC:  Motor and sensation is grossly normal.  Cranial nerves are grossly intact. PSYCH:  Alert and oriented to person, place and time. Affect is normal.  Laboratory Analysis: No results found for this or any previous visit (from the past 24 hour(s)).  Imaging: No results found.  Assessment and Plan: This is a 23 y.o. female with a recurrent left breast abscess.  Discussed with the patient that the rash she's having could be an allergic reaction to the Bactrim.  In her chart, her allergy profile shows allergy to penicillins, but after discussing with her and her mother, it seems that was entered in error.  The patient's mother reports that she's not allergic to penicillins but to sulfur/sulfa drugs.  Have updated her allergy profile and removed PCN and added Bactrim and Sulfa.  Instructed the patient to discard the Bactrim at home.  Instead, will give her a new prescription for Augmentin to treat her current abscess.  No need for I&D at this point as it is healing and there is no further fluctuance.  Will schedule the patient to come back in three weeks to check on the abscess and if all healed, will proceed with in-office excision to prevent future recurrences.  Patient is in agreement.  Face-to-face time spent with the patient and care providers was 40 minutes, with more than 50% of the time spent counseling, educating, and coordinating care of the patient.     Melvyn Neth, [facility]

## 2019-03-08 NOTE — Patient Instructions (Addendum)
Please see your follow up appointment listed below.   Skin Abscess  A skin abscess is an infected area on or under your skin that contains a collection of pus and other material. An abscess may also be called a furuncle, carbuncle, or boil. An abscess can occur in or on almost any part of your body. Some abscesses break open (rupture) on their own. Most continue to get worse unless they are treated. The infection can spread deeper into the body and eventually into your blood, which can make you feel ill. Treatment usually involves draining the abscess. What are the causes? An abscess occurs when germs, like bacteria, pass through your skin and cause an infection. This may be caused by:  A scrape or cut on your skin.  A puncture wound through your skin, including a needle injection or insect bite.  Blocked oil or sweat glands.  Blocked and infected hair follicles.  A cyst that forms beneath your skin (sebaceous cyst) and becomes infected. What increases the risk? This condition is more likely to develop in people who:  Have a weak body defense system (immune system).  Have diabetes.  Have dry and irritated skin.  Get frequent injections or use illegal IV drugs.  Have a foreign body in a wound, such as a splinter.  Have problems with their lymph system or veins. What are the signs or symptoms? Symptoms of this condition include:  A painful, firm bump under the skin.  A bump with pus at the top. This may break through the skin and drain. Other symptoms include:  Redness surrounding the abscess site.  Warmth.  Swelling of the lymph nodes (glands) near the abscess.  Tenderness.  A sore on the skin. How is this diagnosed? This condition may be diagnosed based on:  A physical exam.  Your medical history.  A sample of pus. This may be used to find out what is causing the infection.  Blood tests.  Imaging tests, such as an ultrasound, CT scan, or MRI. How is this  treated? A small abscess that drains on its own may not need treatment. Treatment for larger abscesses may include:  Moist heat or heat pack applied to the area several times a day.  A procedure to drain the abscess (incision and drainage).  Antibiotic medicines. For a severe abscess, you may first get antibiotics through an IV and then change to antibiotics by mouth. Follow these instructions at home: Medicines   Take over-the-counter and prescription medicines only as told by your health care provider.  If you were prescribed an antibiotic medicine, take it as told by your health care provider. Do not stop taking the antibiotic even if you start to feel better. Abscess care   If you have an abscess that has not drained, apply heat to the affected area. Use the heat source that your health care provider recommends, such as a moist heat pack or a heating pad. ? Place a towel between your skin and the heat source. ? Leave the heat on for 20-30 minutes. ? Remove the heat if your skin turns bright red. This is especially important if you are unable to feel pain, heat, or cold. You may have a greater risk of getting burned.  Follow instructions from your health care provider about how to take care of your abscess. Make sure you: ? Cover the abscess with a bandage (dressing). ? Change your dressing or gauze as told by your health care provider. ? Wash  your hands with soap and water before you change the dressing or gauze. If soap and water are not available, use hand sanitizer.  Check your abscess every day for signs of a worsening infection. Check for: ? More redness, swelling, or pain. ? More fluid or blood. ? Warmth. ? More pus or a bad smell. General instructions  To avoid spreading the infection: ? Do not share personal care items, towels, or hot tubs with others. ? Avoid making skin contact with other people.  Keep all follow-up visits as told by your health care provider.  This is important. Contact a health care provider if you have:  More redness, swelling, or pain around your abscess.  More fluid or blood coming from your abscess.  Warm skin around your abscess.  More pus or a bad smell coming from your abscess.  A fever.  Muscle aches.  Chills or a general ill feeling. Get help right away if you:  Have severe pain.  See red streaks on your skin spreading away from the abscess. Summary  A skin abscess is an infected area on or under your skin that contains a collection of pus and other material.  A small abscess that drains on its own may not need treatment.  Treatment for larger abscesses may include having a procedure to drain the abscess and taking an antibiotic. This information is not intended to replace advice given to you by your health care provider. Make sure you discuss any questions you have with your health care provider. Document Released: 03/11/2005 Document Revised: 09/22/2018 Document Reviewed: 07/15/2017 Elsevier Patient Education  2020 [employer].

## 2019-03-10 ENCOUNTER — Telehealth: Payer: Self-pay | Admitting: Obstetrics & Gynecology

## 2019-03-10 NOTE — Telephone Encounter (Signed)
[facility] referring for Abnormal pap, HPV positive. Unable to leave voicemail due to voicemail not set up.

## 2019-03-13 NOTE — Telephone Encounter (Signed)
Called and left voice mail for patient to call back to be schedule °

## 2019-03-15 NOTE — Telephone Encounter (Signed)
Called and left voicemail for patient to call back to be schedule. Contacting PCP due to multiple attempts were unsuccessful. Reported to [place] at [facility] of attempts no being successful

## 2019-03-29 ENCOUNTER — Other Ambulatory Visit: Payer: Self-pay | Admitting: Surgery

## 2019-03-29 ENCOUNTER — Other Ambulatory Visit: Payer: Self-pay

## 2019-03-29 ENCOUNTER — Encounter: Payer: Self-pay | Admitting: Surgery

## 2019-03-29 ENCOUNTER — Ambulatory Visit: Payer: BC Managed Care – PPO | Admitting: Surgery

## 2019-03-29 VITALS — BP 126/84 | HR 76 | Temp 97.7°F | Resp 14 | Ht 65.0 in | Wt 191.4 lb

## 2019-03-29 DIAGNOSIS — N611 Abscess of the breast and nipple: Secondary | ICD-10-CM | POA: Diagnosis not present

## 2019-03-29 MED ORDER — OXYCODONE HCL 5 MG PO TABS: 5.0000 mg | ORAL_TABLET | Freq: Three times a day (TID) | ORAL | 0 refills | Status: DC | PRN

## 2019-03-29 NOTE — Progress Notes (Signed)
  Procedure Date:  03/29/2019  Pre-operative Diagnosis:  Prior left breast abscess  Post-operative Diagnosis:  Prior left breast abscess  Procedure:  Excision of left breast cyst, previously infected  Surgeon:  Melvyn Neth, MD  Assistant:  Deno Etienne, PA-S  Anesthesia:  5 ml 1% lidocaine with epi  Estimated Blood Loss:  5 ml  Specimens:  Left breast cyst  Complications:  None  Indications for Procedure:  This is a 23 y.o. female with diagnosis of a left breast infected cyst, s/p antibiotic management.  She presents today for excision so this does not recur.  The patient wishes to have this excised. The risks of bleeding, abscess or infection, injury to surrounding structures, and need for further procedures were all discussed with the patient and she was willing to proceed.  Description of Procedure: The patient was correctly identified at bedside.  Appropriate time-outs were performed.  The patient's left medial breast was prepped and draped in usual sterile fashion.  Local anesthetic was infused intradermally.  A 5.5 cm elliptical incision was made over the area of previous drainage, in order to incorporate the skin scar.  Sharp dissection was used to dissect down the subcutaneous tissue.  Skin flaps were created sharply as well, and then the cyst was excised.  There was another cyst at the medial portion of the wound, which was incorporated into our specimen.  It was sent off to pathology.  The cavity was then irrigated and hemostasis was assured; one small figure 8 suture was needed.  The wound was then closed in two layers using 3-0 Vicryl and 4-0 Monocryl.  The incision was cleaned and sealed with DermaBond.  The patient tolerated the procedure well and all were appropriately disposed of at the end of the case.  --Patient will follow up in 10 days for wound check --Wear tight sports bra for support and comfort --May take Tylenol and Ibuprofen.  Prescription for  Oxycodone given as well. --May return to work next week.  Work note given.   Melvyn Neth, MD

## 2019-03-29 NOTE — Patient Instructions (Addendum)
Please wear a tight sports bra to keep pressure on the incision area. You may shower 03/30/2019. Please see your follow up appointment listed below.    Epidermal Cyst Removal, Care After This sheet gives you information about how to care for yourself after your procedure. Your health care provider may also give you more specific instructions. If you have problems or questions, contact your health care provider. What can I expect after the procedure? After the procedure, it is common to have:  Soreness in the area where your cyst was removed.  Tightness or itchiness from the stitches (sutures) in your skin. Follow these instructions at home: Medicines  Take over-the-counter and prescription medicines only as told by your health care provider.  If you were prescribed an antibiotic medicine or ointment, take or apply it as told by your health care provider. Do not stop using the antibiotic even if you start to feel better. Incision care   Follow instructions from your health care provider about how to take care of your incision. Make sure you: ? Wash your hands with soap and water before you change your bandage (dressing). If soap and water are not available, use hand sanitizer. ? Change your dressing as told by your health care provider. ? Leave sutures, skin glue, or adhesive strips in place. These skin closures may need to stay in place for 1-2 weeks or longer. If adhesive strip edges start to loosen and curl up, you may trim the loose edges. Do not remove adhesive strips completely unless your health care provider tells you to do that.  Keep the dressingdry until your health care provider says that it can be removed.  After your dressing is off, check your incision area every day for signs of infection. Check for: ? Redness, swelling, or pain. ? Fluid or blood. ? Warmth. ? Pus or a bad smell. General instructions  Do not take baths, swim, or use a hot tub until your health care  provider approves. Ask your health care provider if you may take showers. You may only be allowed to take sponge baths.  Your health care provider may ask you to avoid contact sports or activities that take a lot of effort. Do not do anything that stretches or puts pressure on your incision.  You can return to your normal diet.  Keep all follow-up visits as told by your health care provider. This is important. Contact a health care provider if:  You have a fever.  You have redness, swelling, or pain in the incision area.  You have fluid or blood coming from your incision.  You have pus or a bad smell coming from your incision.  Your incision feels warm to the touch.  Your cyst grows back. Summary  After the procedure, it is common to have soreness in the area where your cyst was removed.  Take or apply over-the-counter and prescription medicines only as told by your health care provider.  Follow instructions from your health care provider about how to take care of your incision. This information is not intended to replace advice given to you by your health care provider. Make sure you discuss any questions you have with your health care provider. Document Released: 06/22/2014 Document Revised: 09/21/2017 Document Reviewed: 03/25/2017 Elsevier Patient Education  2020 [employer].

## 2019-04-07 ENCOUNTER — Encounter: Payer: BC Managed Care – PPO | Admitting: Surgery

## 2019-04-11 ENCOUNTER — Encounter: Payer: BC Managed Care – PPO | Admitting: Surgery

## 2019-04-18 ENCOUNTER — Encounter: Payer: BC Managed Care – PPO | Admitting: Surgery

## 2019-05-05 ENCOUNTER — Other Ambulatory Visit: Payer: Self-pay

## 2019-05-05 ENCOUNTER — Encounter: Payer: Self-pay | Admitting: Surgery

## 2019-05-05 ENCOUNTER — Ambulatory Visit (INDEPENDENT_AMBULATORY_CARE_PROVIDER_SITE_OTHER): Payer: BC Managed Care – PPO | Admitting: Surgery

## 2019-05-05 VITALS — BP 133/84 | HR 74 | Temp 99.0°F | Resp 14 | Ht 65.0 in | Wt 180.0 lb

## 2019-05-05 DIAGNOSIS — Z09 Encounter for follow-up examination after completed treatment for conditions other than malignant neoplasm: Secondary | ICD-10-CM | POA: Diagnosis not present

## 2019-05-05 DIAGNOSIS — N611 Abscess of the breast and nipple: Secondary | ICD-10-CM | POA: Diagnosis not present

## 2019-05-05 NOTE — Progress Notes (Signed)
05/05/2019  HPI: Jeanette Zimmerman is a 23 y.o. female s/p excision of previously infected left breast sebaceous cyst on 10/14.  She presents today because she's having some suture protrude through the skin and she's noticed itching of the incision.  Denies any drainage, erythema, or tenderness.  Vital signs: BP 133/84   Pulse 74   Temp 99 F (37.2 C) (Temporal)   Resp 14   Ht 5\' 5"  (1.651 m)   Wt 180 lb (81.6 kg)   SpO2 97%   BMI 29.95 kg/m    Physical Exam: Constitutional: No acute distres Breast:  Left breast medial lower incision site showing a keloid scar formation.  There are 3 knots of the 3-0 Vicryl suture that are protruding through the skin edges.  No evidence of infection or purulent drainage.  The sutures were pulled out.  No breakdown of the incision.  Assessment/Plan: This is a 23 y.o. female s/p excision of previously infected left breast sebaceous cyst.  --Discussed with patient that the wound is not infected and it's scarring in a keloid formation.  Sutures were removed and there is no evidence of breakdown or fluctuance.  This will continue to heal on its own. --Follow up prn.   Melvyn Neth, [facility]
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
## 2019-10-18 ENCOUNTER — Encounter: Payer: Self-pay | Admitting: Surgery

## 2019-10-18 ENCOUNTER — Other Ambulatory Visit: Payer: Self-pay

## 2019-10-18 ENCOUNTER — Ambulatory Visit: Payer: Self-pay | Admitting: Surgery

## 2019-10-18 VITALS — BP 118/81 | HR 100 | Temp 97.7°F | Resp 14 | Ht 65.0 in | Wt 195.4 lb

## 2019-10-18 DIAGNOSIS — N611 Abscess of the breast and nipple: Secondary | ICD-10-CM

## 2019-10-18 MED ORDER — AMOXICILLIN-POT CLAVULANATE 875-125 MG PO TABS: 1.0000 | ORAL_TABLET | Freq: Two times a day (BID) | ORAL | 0 refills | Status: AC

## 2019-10-18 NOTE — Patient Instructions (Addendum)
We will start you on antibiotics for these areas. If the area seems to be worsening we can see you sooner, Friday am.  We will see you for a follow up next week.

## 2019-10-18 NOTE — Progress Notes (Signed)
10/18/2019  History of Present Illness: Jeanette Zimmerman is a 24 y.o. female s/p excision of a previously infected left breast cyst on 03/29/19.  She presents today because she's noted two more areas that have flared up.  She has been busy with work and has not come sooner, but she reports that the left breast has another cyst in the lower inner quadrant that has flared up a couple of months ago, with intermittent swelling/redness, and then improvement on its own.  It has drained intermittently as well, and most recently drained this week.  Currently it is not draining but it is somewhat tender.  There is an additional area just below the inframammary fold of the outer lower quadrant that has flared up as well with the same intermittent improvement, but has not had any drainage.  Past Medical History: Past Medical History:  Diagnosis Date  . ADHD (attention deficit hyperactivity disorder)   . Anxiety   . Asthma   . Bipolar 1 disorder ([place])   . Depression   . Overdose      Past Surgical History: No past surgical history on file.  Home Medications: Prior to Admission medications   Medication Sig Start Date End Date Taking? Authorizing Provider  amoxicillin-clavulanate (AUGMENTIN) 875-125 MG tablet Take 1 tablet by mouth 2 (two) times daily for 10 days. 10/18/19 10/28/19  Olean Ree, MD    Allergies: Allergies  Allergen Reactions  . Bactrim Ds [Sulfamethoxazole-Trimethoprim]     rash  . Sulfur     rash    Review of Systems: Review of Systems  Constitutional: Negative for chills and fever.  Respiratory: Negative for shortness of breath.   Cardiovascular: Negative for chest pain.  Gastrointestinal: Negative for nausea and vomiting.  Skin: Positive for rash (upper chest, from pollen).       Left breast inflamed cysts x 2 areas    Physical Exam BP 118/81   Pulse 100   Temp 97.7 F (36.5 C)   Resp 14   Ht 5\' 5"  (1.651 m)   Wt 195 lb 6.4 oz (88.6 kg)   LMP 10/03/2019    SpO2 98%   BMI 32.52 kg/m  CONSTITUTIONAL: No acute distress HEENT:  Normocephalic, atraumatic, extraocular motion intact. RESPIRATORY:  Lungs are clear, and breath sounds are equal bilaterally. Normal respiratory effort without pathologic use of accessory muscles. CARDIOVASCULAR: Heart is regular without murmurs, gallops, or rubs. BREAST:  Left breast has an inflamed area of the lower inner quadrant, about 8 o'clock, measuring about 1 x 1.5 cm just lateral to the prior excision done last year.  This is erythematous, with minimal fluctuance, and a punctate wound were prior drainage has occurred.  An additional area below the inframammary fold, in the lower outer quadrant, about 4-5 o'clock, measuring 1 x 1 cm, less raised or erythematous, with no evidence of prior drainage. NEUROLOGIC:  Motor and sensation is grossly normal.  Cranial nerves are grossly intact. PSYCH:  Alert and oriented to person, place and time. Affect is normal.  Labs/Imaging: None  Assessment and Plan: This is a 24 y.o. female with flare up of skin cysts/abscess of the left breast.  --Discussed with her that we can follow the same plan as last year and start her on antibiotic management with Augmentin course.  If this does not improve either area, we can see her later this week for I&D in the office.  However, we can otherwise schedule a follow up appointment next week to reassess  the areas and possibly discuss excision as we did last time.  She's in agreement with this plan and all of her questions have been answered. --Will give her work note for today and for next week's appointment.  Face-to-face time spent with the patient and care providers was 15 minutes, with more than 50% of the time spent counseling, educating, and coordinating care of the patient.     Howie Ill, MD [facility]

## 2019-10-25 ENCOUNTER — Encounter: Payer: Self-pay | Admitting: Emergency Medicine

## 2019-10-25 ENCOUNTER — Other Ambulatory Visit: Payer: Self-pay | Admitting: Surgery

## 2019-10-25 ENCOUNTER — Ambulatory Visit: Payer: Self-pay | Admitting: Surgery

## 2019-10-25 ENCOUNTER — Encounter: Payer: Self-pay | Admitting: Surgery

## 2019-10-25 ENCOUNTER — Other Ambulatory Visit: Payer: Self-pay

## 2019-10-25 VITALS — BP 138/83 | HR 82 | Temp 97.7°F | Resp 12 | Ht 65.0 in | Wt 193.0 lb

## 2019-10-25 DIAGNOSIS — L02213 Cutaneous abscess of chest wall: Secondary | ICD-10-CM

## 2019-10-25 DIAGNOSIS — N611 Abscess of the breast and nipple: Secondary | ICD-10-CM

## 2019-10-25 NOTE — Progress Notes (Signed)
10/25/2019  History of Present Illness: Jeanette Zimmerman is a 24 y.o. female presenting for follow up of a left breast recurring skin abscess.  She was started on Augmentin last week but she reports she has not taken it last night or this morning because she did not feel it was helping.  She has noted that the infected cyst on the medial aspect of left breast is better and non-tender, and the one is the lateral aspect drained on its own earlier.  However, a new one has popped up in the anterior chest wall between the breasts.  There's been no drainage from that but it is tender.  Past Medical History: Past Medical History:  Diagnosis Date  . ADHD (attention deficit hyperactivity disorder)   . Anxiety   . Asthma   . Bipolar 1 disorder ([place])   . Depression   . Overdose      Past Surgical History: History reviewed. No pertinent surgical history.  Home Medications: Prior to Admission medications   Medication Sig Start Date End Date Taking? Authorizing Provider  amoxicillin-clavulanate (AUGMENTIN) 875-125 MG tablet Take 1 tablet by mouth 2 (two) times daily for 10 days. 10/18/19 10/28/19 Yes Olean Ree, MD    Allergies: Allergies  Allergen Reactions  . Bactrim Ds [Sulfamethoxazole-Trimethoprim]     rash  . Sulfur     rash    Review of Systems: Review of Systems  Constitutional: Negative for chills and fever.  Respiratory: Negative for shortness of breath.   Cardiovascular: Negative for chest pain.  Gastrointestinal: Negative for abdominal pain, nausea and vomiting.  Skin: Negative for rash.       New area anterior chest wall of fluctuance    Physical Exam BP 138/83   Pulse 82   Temp 97.7 F (36.5 C)   Resp 12   Ht 5\' 5"  (1.651 m)   Wt 193 lb (87.5 kg)   LMP 10/03/2019   SpO2 98%   BMI 32.12 kg/m  CONSTITUTIONAL: No acute distress HEENT:  Normocephalic, atraumatic, extraocular motion intact. RESPIRATORY:  Lungs are clear, and breath sounds are equal bilaterally.  Normal respiratory effort without pathologic use of accessory muscles. CARDIOVASCULAR: Heart is regular without murmurs, gallops, or rubs. BREAST:  The left medial and lateral infected cyst areas are improving, with less fluctuance and tenderness and erythema.  No I&D needed in these areas. SKIN:  Anterior chest wall, between breasts, there is a 2 cm area of fluctuance with surrounding erythema but no induration, consistent with another infected cyst.  No spontaneous drainage. PSYCH:  Alert and oriented to person, place and time. Affect is normal.  Labs/Imaging: None  Assessment and Plan: This is a 24 y.o. female with recurrent left breast infected skin cysts, and now new anterior chest wall infected skin cyst.  --Discussed with the patient that I think the Augmentin was helping the other two areas in the left breast, but this new area does warrant I&D.  Discussed risks of bleeding, further procedures, injury to surrounding structures, and she's willing to proceed. --Given the frequent recurrences, also discussed that we would send referral to dermatology to continued management.  She may need longer term antibiotics.  Unclear if this could be hydradenitis.   Procedure Date:  10/25/2019  Pre-operative Diagnosis:  Anterior chest wall infected skin cyst  Post-operative Diagnosis:  Anterior chest wall infected skin cyst  Procedure:  Incision and Drainage of anterior chest wall infected skin cyst  Surgeon:  Melvyn Neth, MD  Anesthesia:  5 ml of 1% lidocaine with epi  Estimated Blood Loss:  2 ml  Specimens:  Culture swab  Complications:  None  Indications for Procedure:  This is a 24 y.o. female with diagnosis of an anterior chest wall abscess, requiring drainage procedure.  The risks of bleeding, abscess or infection, injury to surrounding structures, and need for further procedures were all discussed with the patient and was willing to proceed.  Description of Procedure: The  patient was correctly identified at bedside.  Appropriate time-outs were performed prior to procedure.  The patient's anterior chest wall was prepped and draped in usual sterile fashion.  Local anesthetic was infused intradermally.  An elliptical 1 cm incision was made over the abscess, revealing purulent fluid.  This fluid was swabbed for culture and sent to micro.  Small Kelly forceps were used to dissect around the abscess tissue to open any remaining pockets of purulent fluid.  After drainage was completed, the cavity was irrigated and cleaned.  The wound was dressed with 4x4 gauze and tape.  The patient tolerated the procedure well and all sharps were appropriately disposed of at the end of the case.  --Follow up in one week to check on wound --Continue Augmentin for now.  Will call if need to change antibiotics based on culture results.   Howie Ill, MD [facility]

## 2019-10-25 NOTE — Patient Instructions (Addendum)
We have placed a referral to Dermatology. Their office will contact you to schedule an appointment. If you do not hear from their office please call the office back.   Today we have drained your Abscess in the office. The numbing medication will wear off in approximately 4-8 hours. You will have some pain to the area afterwards but should not be as severe as prior to the procedure.  If you have been given antibiotics, please continue to take them after your procedure.  Change your dressing once a day after you shower. You may need to change it twice a day if need to, that is normal. You may take Tylenol and Ibuprofen for pain as needed.   We will see you back as scheduled below.   If you have any questions or concerns prior to your appointment, please call our office and speak with a nurse.  Incision and Drainage Incision and drainage is a surgical procedure to open and drain a fluid-filled sac. The sac may be filled with pus, mucus, or blood. Examples of fluid-filled sacs that may need surgical drainage include cysts, skin infections (abscesses), and red lumps that develop from a ruptured cyst or a small abscess (boils). You may need this procedure if the affected area is large, painful, infected, or not healing well. Tell a health care provider about:  Any allergies you have.  All medicines you are taking, including vitamins, herbs, eye drops, creams, and over-the-counter medicines.  Any problems you or family members have had with anesthetic medicines.  Any blood disorders you have.  Any surgeries you have had.  Any medical conditions you have.  Whether you are pregnant or may be pregnant. What are the risks? Generally, this is a safe procedure. However, problems may occur, including:  Infection.  Bleeding.  Allergic reactions to medicines.  Scarring.  What happens before the procedure?  You may need an ultrasound or other imaging tests to see how large or deep the  fluid-filled sac is.  You may have blood tests to check for infection.  You may get a tetanus shot.  You may be given antibiotic medicine to help prevent infection.  Follow instructions from your health care provider about eating or drinking restrictions.  Ask your health care provider about: ? Changing or stopping your regular medicines. This is especially important if you are taking diabetes medicines or blood thinners. ? Taking medicines such as aspirin and ibuprofen. These medicines can thin your blood. Do not take these medicines before your procedure if your health care provider instructs you not to.  Plan to have someone take you home after the procedure.  If you will be going home right after the procedure, plan to have someone stay with you for 24 hours. What happens during the procedure?  To reduce your risk of infection: ? Your health care team will wash or sanitize their hands. ? Your skin will be washed with soap.  You will be given one or more of the following: ? A medicine to help you relax (sedative). ? A medicine to numb the area (local anesthetic). ? A medicine to make you fall asleep (general anesthetic).  An incision will be made in the top of the fluid-filled sac.  The contents of the sac may be squeezed out, or a syringe or tube (catheter)may be used to empty the sac.  The catheter may be left in place for several weeks to drain any fluid. Or, your health care provider may stitch  open the edges of the incision to make a long-term opening for drainage (marsupialization).  The inside of the sac may be washed out (irrigated) with a sterile solution and packed with gauze before it is covered with a bandage (dressing). The procedure may vary among health care providers and hospitals. What happens after the procedure?  Your blood pressure, heart rate, breathing rate, and blood oxygen level will be monitored often until the medicines you were given have worn  off.  Do not drive for 24 hours if you received a sedative. This information is not intended to replace advice given to you by your health care provider. Make sure you discuss any questions you have with your health care provider. Document Released: 11/25/2000 Document Revised: 11/07/2015 Document Reviewed: 03/22/2015 Elsevier Interactive Patient Education  2017 [employer].   Incision and Drainage, Care After Refer to this sheet in the next few weeks. These instructions provide you with information about caring for yourself after your procedure. Your health care provider may also give you more specific instructions. Your treatment has been planned according to current medical practices, but problems sometimes occur. Call your health care provider if you have any problems or questions after your procedure. What can I expect after the procedure? After the procedure, it is common to have:  Pain or discomfort around your incision site.  Drainage from your incision.  Follow these instructions at home:  Take over-the-counter and prescription medicines only as told by your health care provider.  If you were prescribed an antibiotic medicine, take it as told by your health care provider.Do not stop taking the antibiotic even if you start to feel better.  Followinstructions from your health care provider about: ? How to take care of your incision. ? When and how you should change your packing and bandage (dressing). Wash your hands with soap and water before you change your dressing. If soap and water are not available, use hand sanitizer. ? When you should remove your dressing.  Do not take baths, swim, or use a hot tub until your health care provider approves.  Keep all follow-up visits as told by your health care provider. This is important.  Check your incision area every day for signs of infection. Check for: ? More redness, swelling, or pain. ? More fluid or  blood. ? Warmth. ? Pus or a bad smell. Contact a health care provider if:  Your cyst or abscess returns.  You have a fever.  You have more redness, swelling, or pain around your incision.  You have more fluid or blood coming from your incision.  Your incision feels warm to the touch.  You have pus or a bad smell coming from your incision. Get help right away if:  You have severe pain or bleeding.  You cannot eat or drink without vomiting.  You have decreased urine output.  You become short of breath.  You have chest pain.  You cough up blood.  The area where the incision and drainage occurred becomes numb or it tingles. This information is not intended to replace advice given to you by your health care provider. Make sure you discuss any questions you have with your health care provider. Document Released: 08/24/2011 Document Revised: 11/01/2015 Document Reviewed: 03/22/2015 Elsevier Interactive Patient Education  2017 [employer].

## 2019-11-01 ENCOUNTER — Other Ambulatory Visit: Payer: Self-pay

## 2019-11-01 ENCOUNTER — Ambulatory Visit (INDEPENDENT_AMBULATORY_CARE_PROVIDER_SITE_OTHER): Payer: Self-pay | Admitting: Surgery

## 2019-11-01 ENCOUNTER — Encounter: Payer: Self-pay | Admitting: Surgery

## 2019-11-01 VITALS — BP 130/92 | HR 83 | Temp 94.1°F | Ht 65.0 in | Wt 195.8 lb

## 2019-11-01 DIAGNOSIS — L02213 Cutaneous abscess of chest wall: Secondary | ICD-10-CM

## 2019-11-01 DIAGNOSIS — N611 Abscess of the breast and nipple: Secondary | ICD-10-CM

## 2019-11-01 MED ORDER — CLINDAMYCIN HCL 150 MG PO CAPS: 150.0000 mg | ORAL_CAPSULE | Freq: Three times a day (TID) | ORAL | 0 refills | Status: AC

## 2019-11-01 NOTE — Patient Instructions (Addendum)
Dr.Piscoya discussed with patient that he will sent over prescription for Clindamycin 150 mg three times a day.  Dr.Piscoya recommends patient to get in contact with Dermatologist office to get scheduled for an appointment.  Patient will follow up with Dr.Piscoya in two weeks. Patient will contact our office if notice area of concern worsens.

## 2019-11-01 NOTE — Progress Notes (Signed)
11/01/2019  HPI: RANDEE HUSTON is a 24 y.o. female s/p I&D of an anterior chest wall infected cyst on 10/25/19.  She has a history of prior left breast cyst infections, and had one excised on 03/08/19 and has been treated with abx for recent infected cysts on 10/18/19.  Patient reports that the left lateral cyst is still flared up and she recently squeezed fluid out of it.  The anterior/sternal I&D site is healing well.  The left breast medial cyst is unchanged.  Vital signs: BP (!) 130/92   Pulse 83   Temp (!) 94.1 F (34.5 C) (Temporal)   Ht 5\' 5"  (1.651 m)   Wt 195 lb 12.8 oz (88.8 kg)   LMP 10/03/2019   SpO2 95%   BMI 32.58 kg/m    Physical Exam: Constitutional: No acute distress Skin:  Anterior chest wall I&D site is healing well, without evidence of recurrence.  The left medial cyst site is unchanged with no real fluctuance but feels soft, without erythema or induration.  The left lateral cyst area has granulation tissue, no purulent drainage, but has some tenderness to palpation.  Assessment/Plan: This is a 24 y.o. female s/p I&D of anterior chest wall abscess  --The I&D site is healing well, without complication. --The left breast medial area is unchanged, stable, without worsening infection, but still a bit soft feeling. --The left lateral area appears to be healing/flaring up intermittently, despite of the Augmentin.  I think it's best to change antibiotic therapy as it seems Augmentin is not working well at this point.  Will change to Clindamycin for 10 day course and she will follow up in 2 weeks to reassess.   --Dermatology clinic tried calling patient and she has to call them back.  She will do that today.  I think she'll likely need longer term management of these areas.     22, MD [facility]

## 2019-11-02 LAB — ANAEROBIC AND AEROBIC CULTURE

## 2019-11-15 ENCOUNTER — Ambulatory Visit (INDEPENDENT_AMBULATORY_CARE_PROVIDER_SITE_OTHER): Payer: Self-pay | Admitting: Surgery

## 2019-11-15 ENCOUNTER — Encounter: Payer: Self-pay | Admitting: Surgery

## 2019-11-15 ENCOUNTER — Other Ambulatory Visit: Payer: Self-pay

## 2019-11-15 VITALS — BP 113/84 | HR 92 | Temp 97.7°F | Resp 12 | Ht 65.0 in | Wt 193.0 lb

## 2019-11-15 DIAGNOSIS — L02213 Cutaneous abscess of chest wall: Secondary | ICD-10-CM

## 2019-11-15 NOTE — Progress Notes (Signed)
11/15/2019  HPI: Jeanette Zimmerman is a 24 y.o. female s/p I&D of infected sternal skin cyst on 5/12.  That has since healed, but the patient reports that the area in the medial left breast has flared up again.  There is some mild purulent drainage which she expressed yesterday.  Vital signs: BP 113/84   Pulse 92   Temp 97.7 F (36.5 C) (Temporal)   Resp 12   Ht 5\' 5"  (1.651 m)   Wt 193 lb (87.5 kg)   SpO2 97%   BMI 32.12 kg/m    Physical Exam: Constitutional: No acute distress Skin:  Sternal I&D area is healing well.  The other areas over the inferior portion of the left breast are healing with antibiotic management alone, but the medial left breast area has a small area of fluctuance that has not improved yet, with some mild purulent drainage when pressing on it.    Assessment/Plan: This is a 24 y.o. female s/p I&D of sternal skin abscess, now with another left medial breast infected cyst.  --Offered to do I&D today of this new area that has not improved with abx management.  She would like to postpone to Friday 6/4 to do this in the office. --As precaution, will give her new course of Clindamycin while we wait for I&D on 6/4. --Follow up on 6/4   8/4, MD [facility]

## 2019-11-17 ENCOUNTER — Other Ambulatory Visit: Payer: Self-pay

## 2019-11-17 ENCOUNTER — Encounter: Payer: Self-pay | Admitting: Surgery

## 2019-11-17 ENCOUNTER — Ambulatory Visit (INDEPENDENT_AMBULATORY_CARE_PROVIDER_SITE_OTHER): Payer: Self-pay | Admitting: Surgery

## 2019-11-17 VITALS — BP 107/72 | HR 66 | Temp 97.9°F | Resp 12 | Ht 65.0 in | Wt 195.0 lb

## 2019-11-17 DIAGNOSIS — N611 Abscess of the breast and nipple: Secondary | ICD-10-CM

## 2019-11-17 NOTE — Progress Notes (Signed)
°  Procedure Date:  11/17/2019  Pre-operative Diagnosis:  Left breast infected sebaceous cyst  Post-operative Diagnosis:  Left breast infected sebaceous cyst  Procedure:  Incision and Drainage of left breast infected sebaceous cyst  Surgeon:  Howie Ill, MD  Assistant:  Theron Arista, PA-S  Anesthesia:  5 ml 1% lidocaine with epi  Estimated Blood Loss:  2 ml  Specimens:  Culture swab  Complications:  None  Indications for Procedure:  This is a 24 y.o. female with diagnosis of left breast infected sebaceous cyst, requiring drainage procedure.  The risks of bleeding, abscess or infection, injury to surrounding structures, and need for further procedures were all discussed with the patient and was willing to proceed.  Description of Procedure: The patient was correctly identified at bedside.  Appropriate time-outs were performed prior to procedure.  The patient's medial left breast was prepped and draped in usual sterile fashion.  Local anesthetic was infused intradermally.  A 1 cm elliptical incision was made over the abscess, revealing purulent fluid.  This fluid was swabbed for culture and sent to micro.  Small Kelly forceps were used to dissect around the abscess tissue to open any remaining pockets of purulent fluid.  After drainage was completed, the cavity was irrigated and cleaned.  The wound was dressed with 4x4 gauze and  and tape.  No packing was done as the wound was not too deep.  The patient tolerated the procedure well and all sharps were appropriately disposed of at the end of the case.   Howie Ill, MD

## 2019-11-17 NOTE — Patient Instructions (Signed)
Wash the area with soap and water daily. Rinse well and cover with a dry dressing and secure with tape.  Continue the antibiotic until gone.  Please see your follow up appointment listed below.

## 2019-11-23 LAB — ANAEROBIC AND AEROBIC CULTURE

## 2019-11-27 ENCOUNTER — Encounter: Payer: Self-pay | Admitting: Physician Assistant

## 2019-11-27 ENCOUNTER — Encounter: Payer: Self-pay | Admitting: Emergency Medicine

## 2019-11-27 ENCOUNTER — Ambulatory Visit (INDEPENDENT_AMBULATORY_CARE_PROVIDER_SITE_OTHER): Payer: Self-pay | Admitting: Physician Assistant

## 2019-11-27 ENCOUNTER — Other Ambulatory Visit: Payer: Self-pay

## 2019-11-27 VITALS — BP 130/89 | HR 78 | Temp 97.7°F | Resp 12 | Ht 65.0 in | Wt 191.4 lb

## 2019-11-27 DIAGNOSIS — Z09 Encounter for follow-up examination after completed treatment for conditions other than malignant neoplasm: Secondary | ICD-10-CM

## 2019-11-27 DIAGNOSIS — N611 Abscess of the breast and nipple: Secondary | ICD-10-CM

## 2019-11-27 MED ORDER — CIPROFLOXACIN HCL 500 MG PO TABS: 500.0000 mg | ORAL_TABLET | Freq: Two times a day (BID) | ORAL | 0 refills | Status: AC

## 2019-11-27 NOTE — Patient Instructions (Signed)
Stop taking the Clidamycin antibiotic and start taking the new antibiotic that was sent to your pharmacy.   Follow up as needed, call the office if you have any questions or concerns.   Incision and Drainage, Care After Refer to this sheet in the next few weeks. These instructions provide you with information about caring for yourself after your procedure. Your health care provider may also give you more specific instructions. Your treatment has been planned according to current medical practices, but problems sometimes occur. Call your health care provider if you have any problems or questions after your procedure. What can I expect after the procedure? After the procedure, it is common to have:  Pain or discomfort around your incision site.  Drainage from your incision.  Follow these instructions at home:  Take over-the-counter and prescription medicines only as told by your health care provider.  If you were prescribed an antibiotic medicine, take it as told by your health care provider.Do not stop taking the antibiotic even if you start to feel better.  Followinstructions from your health care provider about: ? How to take care of your incision. ? When and how you should change your packing and bandage (dressing). Wash your hands with soap and water before you change your dressing. If soap and water are not available, use hand sanitizer. ? When you should remove your dressing.  Do not take baths, swim, or use a hot tub until your health care provider approves.  Keep all follow-up visits as told by your health care provider. This is important.  Check your incision area every day for signs of infection. Check for: ? More redness, swelling, or pain. ? More fluid or blood. ? Warmth. ? Pus or a bad smell. Contact a health care provider if:  Your cyst or abscess returns.  You have a fever.  You have more redness, swelling, or pain around your incision.  You have more fluid or  blood coming from your incision.  Your incision feels warm to the touch.  You have pus or a bad smell coming from your incision. Get help right away if:  You have severe pain or bleeding.  You cannot eat or drink without vomiting.  You have decreased urine output.  You become short of breath.  You have chest pain.  You cough up blood.  The area where the incision and drainage occurred becomes numb or it tingles. This information is not intended to replace advice given to you by your health care provider. Make sure you discuss any questions you have with your health care provider. Document Released: 08/24/2011 Document Revised: 11/01/2015 Document Reviewed: 03/22/2015 Elsevier Interactive Patient Education  2017 [employer].

## 2019-11-27 NOTE — Progress Notes (Signed)
[facility] POST-OP OFFICE VISIT  11/27/2019  HPI: Jeanette Zimmerman is a 24 y.o. female 10 days s/p incision and drainage of infected sebaceous cyst of the left breast with Dr Aleen Campi.   Overall doing well No issues with pain, fever, chills, nausea Wound well healed, no erythema or drainage Cultures showed pan-sensitive  pseudomonas, was on clindamycin as of 06/02 and this was not switched when cultures returned     Vital signs: BP 130/89    Pulse 78    Temp 97.7 F (36.5 C)    Resp 12    Ht 5\' 5"  (1.651 m)    Wt 191 lb 6.4 oz (86.8 kg)    SpO2 97%    BMI 31.85 kg/m    Physical Exam: Constitutional: Well appearing female, NAD Skin: Previous incision to the medial left breast/sternum is well healed, no erythema, mild induration, no drainage  Assessment/Plan: This is a 24 y.o. female 10 days s/p incision and drainage of infected sebaceous cyst of the left breast   - I will switch her to ciprofloxacin x1 week to better cover pseudomonas, she will stop clindamycin  - Superficial dressing as needed; wound well healed  - No other issues  - rtc prn  -- 22, PA-C [facility] 11/27/2019, 10:07 AM [phone] M-F: 7am - 4pm

## 2020-02-01 ENCOUNTER — Ambulatory Visit (INDEPENDENT_AMBULATORY_CARE_PROVIDER_SITE_OTHER): Payer: Self-pay | Admitting: Physician Assistant

## 2020-02-01 ENCOUNTER — Encounter: Payer: Self-pay | Admitting: Physician Assistant

## 2020-02-01 ENCOUNTER — Other Ambulatory Visit: Payer: Self-pay

## 2020-02-01 VITALS — BP 125/88 | HR 74 | Temp 98.3°F | Ht 65.0 in | Wt 197.0 lb

## 2020-02-01 DIAGNOSIS — N611 Abscess of the breast and nipple: Secondary | ICD-10-CM

## 2020-02-01 MED ORDER — CIPROFLOXACIN HCL 500 MG PO TABS: 500.0000 mg | ORAL_TABLET | Freq: Two times a day (BID) | ORAL | 0 refills | Status: AC

## 2020-02-01 NOTE — Patient Instructions (Signed)
We will place you back on antibiotics today. Let us know if you start to have any fevers, chills, or increased pain.   Follow up as needed.

## 2020-02-01 NOTE — Progress Notes (Signed)
[facility] SURGICAL CL[facility]IC NOTE  02/01/2020  History of Present Illness: Jeanette Zimmerman is a 24 y.o. female with a history of left breast abscess and infected sebaceous cyst last I&D on 06/04 with Dr Aleen Campi which ultimately grew out pseudomonas and ABx were switched to Ciprofloxacin x1 week when Cx returned. She presents to the clinic today with concerns over the same. She reports that in the last 2 weeks or so, she has noticed that the same ar on her medial upper left breast has been getting swollen, drains, gets better, and then returns a few days later. This is similar to her previous presentations for abscesses there. She denied any fever or chills at home. No other additional symptoms. She wants to try and avoid I&D if possible but was concerned over the frequent recurrences as of late. Otherwise doing well.    Past Medical History: Past Medical History:  Diagnosis Date  . ADHD (attention deficit hyperactivity disorder)   . Anxiety   . Asthma   . Bipolar 1 disorder (HCC)   . Depression   . Overdose      Past Surgical History: No past surgical history on file.  Home Medications: Prior to Admission medications   Not on File    Allergies: Allergies  Allergen Reactions  . Bactrim Ds [Sulfamethoxazole-Trimethoprim]     rash  . Sulfur     rash    Review of Systems: Review of Systems  Constitutional: Negative for chills and fever.  Respiratory: Negative for cough and shortness of breath.   Cardiovascular: Negative for chest pain and palpitations.  Gastrointestinal: Negative for abdominal pain, constipation, diarrhea, nausea and vomiting.  Skin:       + Breast Abscess  All other systems reviewed and are negative.   Physical Exam There were no vitals taken for this visit.  Physical Exam Constitutional:      General: She is not in acute distress.    Appearance: Normal appearance. She is obese. She is not ill-appearing.  HENT:     Head:  Normocephalic and atraumatic.  Eyes:     Conjunctiva/sclera: Conjunctivae normal.     Pupils: Pupils are equal, round, and reactive to light.  Pulmonary:     Effort: Pulmonary effort is normal. No respiratory distress.  Chest:    Genitourinary:    Comments: Deferred Musculoskeletal:     Right lower leg: No edema.     Left lower leg: No edema.  Skin:    General: Skin is warm and dry.     Findings: Erythema (Left medial upper breast, mild) present.  Neurological:     General: No focal deficit present.     Mental Status: She is alert and oriented to person, place, and time.  Psychiatric:        Mood and Affect: Mood normal.        Behavior: Behavior normal.     Labs/Imaging: No new labs or imaging  Assessment and Plan: This is a 24 y.o. female recurrent left breast small abscess and previous infected sebaceous cyst without any evidence of fluctuance or crepitus on examination today.    - I do not appreciate any fluctuance on examination today, and I do not think she warrants I&D at this time.  - I will send her in a Rx for Ciprofloxacin for 10 days, based on Cx results from previous I&Ds  - Work note provided  - Tylenol and Ibuprofen OTC for pain  - Superficial dressings as  need for drainage  - She understands to call our office if this area worsens, she develops fevers, or gets no relief from Abx.     All questions and concerns were addressed and answered.   Face-to-face time spent with the patient and care providers was 15 minutes, with more than 50% of the time spent counseling, educating, and coordinating care of the patient.     Lynden Oxford, PA-C [facility] 02/01/2020, 11:13 AM [phone] M-F: 7am - 4pm

## 2020-03-04 ENCOUNTER — Ambulatory Visit
Admission: EM | Admit: 2020-03-04 | Discharge: 2020-03-04 | Disposition: A | Discharge status: Home / Self Care | Payer: HRSA Program | Attending: Physician Assistant | Admitting: Physician Assistant

## 2020-03-04 ENCOUNTER — Other Ambulatory Visit: Payer: Self-pay

## 2020-03-04 DIAGNOSIS — J45909 Unspecified asthma, uncomplicated: Secondary | ICD-10-CM | POA: Diagnosis not present

## 2020-03-04 DIAGNOSIS — F1420 Cocaine dependence, uncomplicated: Secondary | ICD-10-CM | POA: Diagnosis not present

## 2020-03-04 DIAGNOSIS — U071 COVID-19: Secondary | ICD-10-CM | POA: Diagnosis not present

## 2020-03-04 DIAGNOSIS — R05 Cough: Secondary | ICD-10-CM | POA: Diagnosis present

## 2020-03-04 DIAGNOSIS — B349 Viral infection, unspecified: Secondary | ICD-10-CM | POA: Diagnosis present

## 2020-03-04 DIAGNOSIS — F17210 Nicotine dependence, cigarettes, uncomplicated: Secondary | ICD-10-CM | POA: Diagnosis not present

## 2020-03-04 DIAGNOSIS — R509 Fever, unspecified: Secondary | ICD-10-CM | POA: Diagnosis present

## 2020-03-04 DIAGNOSIS — R059 Cough, unspecified: Secondary | ICD-10-CM

## 2020-03-04 DIAGNOSIS — F319 Bipolar disorder, unspecified: Secondary | ICD-10-CM | POA: Diagnosis not present

## 2020-03-04 DIAGNOSIS — F1220 Cannabis dependence, uncomplicated: Secondary | ICD-10-CM | POA: Diagnosis not present

## 2020-03-04 NOTE — Discharge Instructions (Addendum)

## 2020-03-04 NOTE — ED Triage Notes (Addendum)
Patient reports sudden onset of chills, nasal congestion/drainage, cough, and body aches yesterday. Patient works for [employer] system; agreeable to COVID testing.   Patient needs work note.

## 2020-03-04 NOTE — ED Provider Notes (Signed)
[facility]    CSN: [id number] Arrival date & time: 03/04/20  1334      History   Chief Complaint Chief Complaint  Patient presents with   Cough   Generalized Body Aches   Chills    HPI Jeanette Zimmerman is a 24 y.o. female.   Patient presents for sudden onset of chills, nasal congestion, cough and myalgias yesterday.  Says she has felt feverish.  She denies headaches, weakness, chest pain, breathing difficulty, abdominal pain, nausea, vomiting, diarrhea, changes in smell or taste.  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  She denies having her Covid vaccination.  Patient says she does have asthma, but has not needed to use an inhaler in years.  She has taken ibuprofen for body aches, but no other medications.  She has no other concerns today.  HPI  Past Medical History:  Diagnosis Date   ADHD (attention deficit hyperactivity disorder)    Anxiety    Asthma    Bipolar 1 disorder (HCC)    Depression    Overdose     Patient Active Problem List   Diagnosis Date Noted   Major depressive disorder, recurrent severe without psychotic features (HCC) 05/10/2018   Cannabis use disorder, moderate, dependence (HCC) 05/10/2018   Tobacco use disorder 05/10/2018   Suicide attempt by cutting of wrist (HCC) 05/10/2018   Elevated troponin 11/07/2016   Substance induced mood disorder (HCC) 10/31/2015   Alcohol use disorder, moderate, dependence (HCC) 10/31/2015   Cocaine use disorder, moderate, dependence (HCC) 10/31/2015   Dysthymia 10/31/2015    History reviewed. No pertinent surgical history.  OB History   No obstetric history on file.      Home Medications    Prior to Admission medications   Not on File    Family History Family History  Problem Relation Age of Onset   Heart murmur Mother    Diabetes Father    Asthma Father     Social  History Social History   Tobacco Use   Smoking status: Current Every Day Smoker    Packs/day: 0.50    Types: Cigarettes    Start date: 01/21/2009   Smokeless tobacco: Never Used  Substance Use Topics   Alcohol use: Yes    Alcohol/week: 3.0 - 4.0 standard drinks    Types: 3 - 4 Standard drinks or equivalent per week   Drug use: Not Currently    Types: Marijuana, Cocaine    Comment: last used 2-3 nights ago marijuana;   last used cocain about a month ago     Allergies   Bactrim ds [sulfamethoxazole-trimethoprim] and Sulfur   Review of Systems Review of Systems  Constitutional: Positive for chills, fatigue and fever. Negative for diaphoresis.  HENT: Positive for congestion and rhinorrhea. Negative for ear pain, sinus pressure, sinus pain and sore throat.   Respiratory: Positive for cough. Negative for shortness of breath.   Gastrointestinal: Negative for abdominal pain, nausea and vomiting.  Musculoskeletal: Negative for arthralgias and myalgias.  Skin: Negative for rash.  Neurological: Negative for weakness and headaches.  Hematological: Negative for adenopathy.     Physical Exam Triage Vital Signs ED Triage Vitals  Enc Vitals Group     BP --      Pulse Rate 03/04/20 1408 100     Resp 03/04/20 1408 19     Temp 03/04/20 1408 (!)  100.4 F (38 C)     Temp src --      SpO2 03/04/20 1408 99 %     Weight --      Height --      Head Circumference --      Peak Flow --      Pain Score 03/04/20 1405 5     Pain Loc --      Pain Edu? --      Excl. in GC? --    No data found.  Updated Vital Signs Pulse 100    Temp (!) 100.4 F (38 C)    Resp 19    LMP 02/28/2020 (Exact Date)    SpO2 99%   Physical Exam Vitals and nursing note reviewed.  Constitutional:      General: She is not in acute distress.    Appearance: Normal appearance. She is not ill-appearing or toxic-appearing.  HENT:     Head: Normocephalic and atraumatic.     Nose: Congestion present.      Mouth/Throat:     Mouth: Mucous membranes are moist.     Pharynx: Oropharynx is clear. Posterior oropharyngeal erythema present.  Eyes:     General: No scleral icterus.       Right eye: No discharge.        Left eye: No discharge.     Conjunctiva/sclera: Conjunctivae normal.  Cardiovascular:     Rate and Rhythm: Normal rate and regular rhythm.     Heart sounds: Normal heart sounds.  Pulmonary:     Effort: Pulmonary effort is normal. No respiratory distress.     Breath sounds: Normal breath sounds.  Musculoskeletal:     Cervical back: Neck supple.  Skin:    General: Skin is dry.  Neurological:     General: No focal deficit present.     Mental Status: She is alert. Mental status is at baseline.     Motor: No weakness.     Gait: Gait normal.  Psychiatric:        Mood and Affect: Mood normal.        Behavior: Behavior normal.        Thought Content: Thought content normal.      UC Treatments / Results  Labs (all labs ordered are listed, but only abnormal results are displayed) Labs Reviewed  SARS CORONAVIRUS 2 (TAT 6-24 HRS)    EKG   Radiology No results found.  Procedures Procedures (including critical care time)  Medications Ordered in UC Medications - No data to display  Initial Impression / Assessment and Plan / UC Course  I have reviewed the triage vital signs and the nursing notes.  Pertinent labs & imaging results that were available during my care of the patient were reviewed by me and considered in my medical decision making (see chart for details).   Patient presenting for fever, cough, congestion.  Patient not vaccinated for Covid.  Covid testing obtained today.  Discussed CDC guidelines, isolation protocol and ED precautions.  Work note provided for 3 days.  Advised her she will need ER longer if she test positive for Covid.  Patient declined cough medication today.  Advised her Covid test is negative she likely has another viral illness and should get  better over the next 7 to 10 days.  She can use over-the-counter cough medication.  Advised her to increase rest and fluid intake as well.  She can take Tylenol or Motrin for fever control.  Go  to emergency room for any fever she cannot control, chest pain, breathing difficulty, weakness.  Final Clinical Impressions(s) / UC Diagnoses   Final diagnoses:  Viral illness  Cough  Fever, unspecified     Discharge Instructions     URI/COLD SYMPTOMS: Your exam today is consistent with a viral illness. Antibiotics are not indicated at this time. Use medications as directed, including cough syrup, nasal saline, and decongestants. Your symptoms should improve over the next few days and resolve within 7-10 days. Increase rest and fluids. F/u if symptoms worsen or predominate such as sore throat, ear pain, productive cough, shortness of breath, or if you develop high fevers or worsening fatigue over the next several days.    You have received COVID testing today either for positive exposure, concerning symptoms that could be related to COVID infection, screening purposes, or re-testing after confirmed positive.  Your test obtained today checks for active viral infection in the last 1-2 weeks. If your test is negative now, you can still test positive later. So, if you do develop symptoms you should either get re-tested and/or isolate x 10 days. Please follow CDC guidelines.  While Rapid antigen tests come back in 15-20 minutes, send out PCR/molecular test results typically come back within 24 hours. In the mean time, if you are symptomatic, assume this could be a positive test and treat/monitor yourself as if you do have COVID.   We will call with test results. Please download the MyChart app and set up a profile to access test results.   If symptomatic, go home and rest. Push fluids. Take Tylenol as needed for discomfort. Gargle warm salt water. Throat lozenges. Take Mucinex DM or Robitussin for cough.  Humidifier in bedroom to ease coughing. Warm showers. Also review the COVID handout for more information.  COVID-19 INFECTION: The incubation period of COVID-19 is approximately 14 days after exposure, with most symptoms developing in roughly 4-5 days. Symptoms may range in severity from mild to critically severe. Roughly 80% of those infected will have mild symptoms. People of any age may become infected with COVID-19 and have the ability to transmit the virus. The most common symptoms include: fever, fatigue, cough, body aches, headaches, sore throat, nasal congestion, shortness of breath, nausea, vomiting, diarrhea, changes in smell and/or taste.    COURSE OF ILLNESS Some patients may begin with mild disease which can progress quickly into critical symptoms. If your symptoms are worsening please call ahead to the Emergency Department and proceed there for further treatment. Recovery time appears to be roughly 1-2 weeks for mild symptoms and 3-6 weeks for severe disease.   GO IMMEDIATELY TO ER FOR FEVER YOU ARE UNABLE TO GET DOWN WITH TYLENOL, BREATHING PROBLEMS, CHEST PAIN, FATIGUE, LETHARGY, INABILITY TO EAT OR DRINK, ETC  QUARANTINE AND ISOLATION: To help decrease the spread of COVID-19 please remain isolated if you have COVID infection or are highly suspected to have COVID infection. This means -stay home and isolate to one room in the home if you live with others. Do not share a bed or bathroom with others while ill, sanitize and wipe down all countertops and keep common areas clean and disinfected. You may discontinue isolation if you have a mild case and are asymptomatic 10 days after symptom onset as long as you have been fever free >24 hours without having to take Motrin or Tylenol. If your case is more severe (meaning you develop pneumonia or are admitted in the hospital), you may have to  isolate longer.   If you have been in close contact (within 6 feet) of someone diagnosed with COVID 19, you  are advised to quarantine in your home for 14 days as symptoms can develop anywhere from 2-14 days after exposure to the virus. If you develop symptoms, you  must isolate.  Most current guidelines for COVID after exposure -isolate 10 days if you ARE NOT tested for COVID as long as symptoms do not develop -isolate 7 days if you are tested and remain asymptomatic -You do not necessarily need to be tested for COVID if you have + exposure and        develop   symptoms. Just isolate at home x10 days from symptom onset During this global pandemic, CDC advises to practice social distancing, try to stay at least 14ft away from others at all times. Wear a face covering. Wash and sanitize your hands regularly and avoid going anywhere that is not necessary.  KEEP IN MIND THAT THE COVID TEST IS NOT 100% ACCURATE AND YOU SHOULD STILL DO EVERYTHING TO PREVENT POTENTIAL SPREAD OF VIRUS TO OTHERS (WEAR MASK, WEAR GLOVES, WASH HANDS AND SANITIZE REGULARLY). IF INITIAL TEST IS NEGATIVE, THIS MAY NOT MEAN YOU ARE DEFINITELY NEGATIVE. MOST ACCURATE TESTING IS DONE 5-7 DAYS AFTER EXPOSURE.   It is not advised by CDC to get re-tested after receiving a positive COVID test since you can still test positive for weeks to months after you have already cleared the virus.   *If you have not been vaccinated for COVID, I strongly suggest you consider getting vaccinated as long as there are no contraindications.      ED Prescriptions    None     PDMP not reviewed this encounter.   Shirlee Latch, PA-C 03/04/20 1432

## 2020-03-05 LAB — SARS CORONAVIRUS 2 (TAT 6-24 HRS): SARS Coronavirus 2: POSITIVE — AB

## 2020-03-06 ENCOUNTER — Telehealth: Payer: Self-pay | Admitting: Infectious Diseases

## 2020-03-06 NOTE — Telephone Encounter (Signed)
Called to Discuss with patient about Covid symptoms and the use of the monoclonal antibody infusion for those with mild to moderate Covid symptoms and at a high risk of hospitalization.     Pt appears to qualify for this infusion due to co-morbid conditions and/or a member of an at-risk group in accordance with the FDA Emergency Use Authorization.    Unable to reach pt  Secure text message sent.

## 2020-03-31 NOTE — Telephone Encounter (Signed)
ERROR

## 2020-08-14 ENCOUNTER — Ambulatory Visit (INDEPENDENT_AMBULATORY_CARE_PROVIDER_SITE_OTHER): Payer: Self-pay | Admitting: Surgery

## 2020-08-14 ENCOUNTER — Other Ambulatory Visit: Payer: Self-pay

## 2020-08-14 ENCOUNTER — Encounter: Payer: Self-pay | Admitting: Surgery

## 2020-08-14 VITALS — BP 111/76 | HR 83 | Temp 97.6°F | Ht 65.0 in | Wt 186.8 lb

## 2020-08-14 DIAGNOSIS — N611 Abscess of the breast and nipple: Secondary | ICD-10-CM

## 2020-08-14 NOTE — Patient Instructions (Addendum)
Warm compress on both areas multiple times a day. Please see your follow up appointment listed below. Continue taking the Antibiotic.

## 2020-08-14 NOTE — Progress Notes (Signed)
08/14/2020  History of Present Illness: Jeanette Zimmerman is a 25 y.o. female with a history of recurrent breast abscess.  She's had an I&D of both left breast abscess and right breast abscess in the past.  She presents today because of recurrence.  She reports that about two weeks ago she started noticing a flare up in the medial right breast area.  This continued getting worse, with increasing size and tenderness.  Her PCP started her on Doxycycline and yesterday starting having some spontaneous drainage.  After the drainage, she feels much better today, with decreased tenderness and decreased swelling/redness.  On the left breast, she's noticed another area which is much smaller and seems stable.  Denies any fevers, chills, chest pain, shortness of breath.  Past Medical History: Past Medical History:  Diagnosis Date  . Abscess of left breast   . Abscess of right breast   . ADHD (attention deficit hyperactivity disorder)   . Anxiety   . Asthma   . Bipolar 1 disorder (HCC)   . Depression   . Overdose      Past Surgical History: Past Surgical History:  Procedure Laterality Date  . INCISION AND DRAINAGE BREAST ABSCESS      Home Medications: Prior to Admission medications   Medication Sig Start Date End Date Taking? Authorizing Provider  doxycycline (VIBRA-TABS) 100 MG tablet Take 100 mg by mouth 2 (two) times daily. 08/08/20  Yes [provider]  mupirocin ointment (BACTROBAN) 2 % 2 (two) times daily. 08/08/20  Yes [provider]    Allergies: Allergies  Allergen Reactions  . Bactrim Ds [Sulfamethoxazole-Trimethoprim]     rash  . Elemental Sulfur     rash    Review of Systems: Review of Systems  Constitutional: Negative for chills and fever.  Respiratory: Negative for shortness of breath.   Cardiovascular: Negative for chest pain.  Gastrointestinal: Negative for abdominal pain, nausea and vomiting.    Physical Exam BP 111/76   Pulse 83   Temp 97.6  F (36.4 C) (Oral)   Ht 5\' 5"  (1.651 m)   Wt 186 lb 12.8 oz (84.7 kg)   SpO2 96%   BMI 31.09 kg/m  CONSTITUTIONAL: No acute distress HEENT:  Normocephalic, atraumatic, extraocular motion intact. RESPIRATORY:  Normal respiratory effort without pathologic use of accessory muscles. CARDIOVASCULAR: Regular rhythm and rate. BREAST:  Right medial breast with an 5 cm area of erythema and induration consistent with recent abscess, with small 3 mm area of ulceration which has drained recently.  Some seropurulent fluid can be expressed with deep palpation, but otherwise no significant fluctuance.  On the left medial breast, there's a 1 x 3 cm area of inflammation / induration, with only mild erythema and tenderness.  No fluid can be expressed from that area. NEUROLOGIC:  Motor and sensation is grossly normal.  Cranial nerves are grossly intact. PSYCH:  Alert and oriented to person, place and time. Affect is normal.   Assessment and Plan: This is a 25 y.o. female with recurrent right breast abscess and likely forming left breast abscess.  --The patient reports that with the Doxycycline, things have improved a lot, particularly since the right side started draining spontaneously.  She would like to avoid another I&D if possible.  I think at this point with her improvement, we can hold off for now.  She will continue taking her Doxycycline and I also recommended that she start warm compresses in both areas to promote further drainage. --She will  follow up on Monday 3/7.  If there is no improvement, we'll proceed with I&D.  Face-to-face time spent with the patient and care providers was 15 minutes, with more than 50% of the time spent counseling, educating, and coordinating care of the patient.     Howie Ill, MD [facility]

## 2020-08-19 ENCOUNTER — Ambulatory Visit: Payer: Self-pay | Admitting: Surgery

## 2022-10-13 ENCOUNTER — Encounter: Payer: Self-pay | Admitting: Advanced Practice Midwife

## 2022-10-13 ENCOUNTER — Ambulatory Visit: Payer: Medicaid Other | Admitting: Advanced Practice Midwife

## 2022-10-13 DIAGNOSIS — Z113 Encounter for screening for infections with a predominantly sexual mode of transmission: Secondary | ICD-10-CM | POA: Diagnosis not present

## 2022-10-13 LAB — WET PREP FOR TRICH, YEAST, CLUE
Trichomonas Exam: NEGATIVE
Yeast Exam: NEGATIVE

## 2022-10-13 LAB — HM HEPATITIS C SCREENING LAB: HM Hepatitis Screen: NEGATIVE

## 2022-10-13 LAB — HM HIV SCREENING LAB: HM HIV Screening: NEGATIVE

## 2022-10-13 NOTE — Progress Notes (Signed)
Pt here for STI screening.  Wet mount results reviewed with patient.  No treatment needed at this time per standing orders.  Condoms declined.-Collins Scotland, RN

## 2022-10-13 NOTE — Progress Notes (Signed)
[facility] Department  STI clinic/screening visit [address] [place] [address] [phone]  Subjective:  Jeanette Zimmerman is a 27 y.o. SBF G1P0 smoker female being seen today for an STI screening visit. The patient reports they do not have symptoms.  Patient reports that they do not desire a pregnancy in the next year.   They reported they are not interested in discussing contraception today.    Patient's last menstrual period was 09/23/2022.  Patient has the following medical conditions:   Patient Active Problem List   Diagnosis Date Noted   Major depressive disorder, recurrent severe without psychotic features (HCC) 05/10/2018   Cannabis use disorder, moderate, dependence (HCC) 05/10/2018   Tobacco use disorder 05/10/2018   Suicide attempt by cutting of wrist (HCC) 05/10/2018   Elevated troponin 11/07/2016   Substance induced mood disorder (HCC) 10/31/2015   Alcohol use disorder, moderate, dependence (HCC) 10/31/2015   Cocaine use disorder, moderate, dependence (HCC) 10/31/2015   Dysthymia 10/31/2015    Chief Complaint  Patient presents with   SEXUALLY TRANSMITTED DISEASE    No sympt    HPI  Patient reports asymptomatic. LMP 09/23/22. Last sex 04/2022 with female. Smoking and last vaped 6 mo ago. Last cigar last night. Last MJ last night. Last cocaine 2019. Last ETOH 10/09/22 (4 beers) qo weekend.    Does the patient using douching products? No  Last HIV test per patient/review of record was No results found for: "HMHIVSCREEN"  Lab Results  Component Value Date   HIV Non Reactive 11/07/2016   Patient reports last pap was No results found for: "DIAGPAP" No results found for: "SPECADGYN"  Screening for MPX risk: Does the patient have an unexplained rash? No Is the patient MSM? No Does the patient endorse multiple sex partners or anonymous sex partners? No Did the patient have close or sexual contact with a person diagnosed with MPX?  No Has the patient traveled outside the [place] where MPX is endemic? No Is there a high clinical suspicion for MPX-- evidenced by one of the following No  -Unlikely to be chickenpox  -Lymphadenopathy  -Rash that present in same phase of evolution on any given body part See flowsheet for further details and programmatic requirements.   Immunization history:  Immunization History  Administered Date(s) Administered   Tdap 05/10/2018     The following portions of the patient's history were reviewed and updated as appropriate: allergies, current medications, past medical history, past social history, past surgical history and problem list.  Objective:  There were no vitals filed for this visit.  Physical Exam Vitals and nursing note reviewed.  Constitutional:      Appearance: Normal appearance. She is obese.  HENT:     Head: Normocephalic and atraumatic.     Mouth/Throat:     Mouth: Mucous membranes are moist.     Pharynx: Oropharynx is clear. No oropharyngeal exudate or posterior oropharyngeal erythema.  Eyes:     Conjunctiva/sclera: Conjunctivae normal.  Pulmonary:     Effort: Pulmonary effort is normal.  Abdominal:     Palpations: Abdomen is soft. There is no mass.     Tenderness: There is no abdominal tenderness. There is no rebound.     Comments: Soft without masses or tenderness  Genitourinary:    General: Normal vulva.     Exam position: Lithotomy position.     Pubic Area: No rash or pubic lice.      Labia:  Right: No rash or lesion.        Left: No rash or lesion.      Vagina: Vaginal discharge (grey leukorrhea, ph<4.5) present. No erythema, bleeding or lesions.     Cervix: Normal.     Uterus: Normal.      Adnexa: Right adnexa normal and left adnexa normal.     Rectum: Normal.     Comments: pH = <4.5 Lymphadenopathy:     Head:     Right side of head: No preauricular or posterior auricular adenopathy.     Left side of head: No preauricular or posterior  auricular adenopathy.     Cervical: No cervical adenopathy.     Right cervical: No superficial, deep or posterior cervical adenopathy.    Left cervical: No superficial, deep or posterior cervical adenopathy.     Upper Body:     Right upper body: No supraclavicular, axillary or epitrochlear adenopathy.     Left upper body: No supraclavicular, axillary or epitrochlear adenopathy.     Lower Body: No right inguinal adenopathy. No left inguinal adenopathy.  Skin:    General: Skin is warm and dry.     Findings: No rash.  Neurological:     Mental Status: She is alert and oriented to person, place, and time.      Assessment and Plan:  PETRICE BEEDY is a 27 y.o. female presenting to the [facility] Department for STI screening  1. Screening examination for venereal disease Treat wet mount per standing orders Immunization nurse consult Pt desires contact info for Kathreen Cosier, LCSW please  - WET PREP FOR TRICH, YEAST, CLUE - Chlamydia/Gonorrhea [facility] - HIV/HCV [facility] - Syphilis Serology, [facility]   Patient accepted all screenings including vaginal CT/GC and bloodwork for HIV/RPR, and wet prep. Patient meets criteria for HepB screening? Yes. Ordered? no Patient meets criteria for HepC screening? Yes. Ordered? yes  Treat wet prep per standing order Discussed time line for [facility] results and that patient will be called with positive results and encouraged patient to call if she had not heard in 2 weeks.  Counseled to return or seek care for continued or worsening symptoms Recommended repeat testing in 3 months with positive results. Recommended condom use with all sex  Patient is currently using  nothing  to prevent pregnancy.    No follow-ups on file.  No future appointments.  Alberteen Spindle, CNM

## 2022-11-16 ENCOUNTER — Emergency Department: Payer: Medicaid Other

## 2022-11-16 ENCOUNTER — Other Ambulatory Visit: Payer: Self-pay

## 2022-11-16 ENCOUNTER — Emergency Department
Admission: EM | Admit: 2022-11-16 | Discharge: 2022-11-16 | Discharge status: Against Medical Advice | Payer: Medicaid Other | Attending: Emergency Medicine | Admitting: Emergency Medicine

## 2022-11-16 DIAGNOSIS — Z5321 Procedure and treatment not carried out due to patient leaving prior to being seen by health care provider: Secondary | ICD-10-CM | POA: Diagnosis not present

## 2022-11-16 DIAGNOSIS — M25531 Pain in right wrist: Secondary | ICD-10-CM | POA: Insufficient documentation

## 2022-11-16 DIAGNOSIS — S80812A Abrasion, left lower leg, initial encounter: Secondary | ICD-10-CM | POA: Insufficient documentation

## 2022-11-16 DIAGNOSIS — S80811A Abrasion, right lower leg, initial encounter: Secondary | ICD-10-CM | POA: Insufficient documentation

## 2022-11-16 DIAGNOSIS — R0781 Pleurodynia: Secondary | ICD-10-CM | POA: Insufficient documentation

## 2022-11-16 DIAGNOSIS — R519 Headache, unspecified: Secondary | ICD-10-CM | POA: Diagnosis not present

## 2022-11-16 NOTE — ED Notes (Signed)
Pt heard yelling and screaming while on phone. NT Hydrographic surveyor) asked pt was she ok, pt didn't respond.
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
## 2022-11-16 NOTE — ED Notes (Signed)
Pt came out of treatment room demanding a provider now "Can you get a provider in here now or I am going to leave", advised pt provider is in another room at this time seeing a patient, he will be in shortly to evaluate her". Pt then stated, I'm fucking going to the lobby" asked if she was coming back pt stated "no, I'm fucking leaving"..the patient seen walking out the department to the WR, pt refused to sign AMA  Prior to this RN encounter with pt, multiple staff members heard pt yelling and cussing in her room while on the phone with unknown personal, NT went to check on pt, asked if she was okay, that NT stated pt did not respond to her, but kept yelling on her phone.

## 2022-11-16 NOTE — ED Notes (Signed)
Observed pt walk out lobby doors and walk up parking lot

## 2023-02-22 ENCOUNTER — Ambulatory Visit: Payer: Medicaid Other

## 2023-03-15 ENCOUNTER — Ambulatory Visit: Payer: Medicaid Other

## 2023-05-03 ENCOUNTER — Emergency Department
Admission: EM | Admit: 2023-05-03 | Discharge: 2023-05-03 | Disposition: A | Discharge status: Home / Self Care | Payer: Medicaid Other | Attending: Emergency Medicine | Admitting: Emergency Medicine

## 2023-05-03 ENCOUNTER — Other Ambulatory Visit: Payer: Self-pay

## 2023-05-03 ENCOUNTER — Ambulatory Visit: Payer: Medicaid Other | Admitting: Internal Medicine

## 2023-05-03 DIAGNOSIS — J45909 Unspecified asthma, uncomplicated: Secondary | ICD-10-CM | POA: Insufficient documentation

## 2023-05-03 DIAGNOSIS — R21 Rash and other nonspecific skin eruption: Secondary | ICD-10-CM | POA: Diagnosis present

## 2023-05-03 DIAGNOSIS — L232 Allergic contact dermatitis due to cosmetics: Secondary | ICD-10-CM | POA: Diagnosis not present

## 2023-05-03 MED ORDER — DEXAMETHASONE SODIUM PHOSPHATE 10 MG/ML IJ SOLN
10.0000 mg | Freq: Once | INTRAMUSCULAR | Status: AC
Start: 2023-05-03 — End: 2023-05-03
  Administered 2023-05-03: 10 mg via INTRAMUSCULAR
  Filled 2023-05-03: qty 1

## 2023-05-03 MED ORDER — BETAMETHASONE VALERATE 0.1 % EX OINT: 1.0000 | TOPICAL_OINTMENT | Freq: Two times a day (BID) | CUTANEOUS | 0 refills | Status: AC

## 2023-05-03 MED ORDER — PREDNISONE 10 MG (48) PO TBPK: ORAL_TABLET | ORAL | 0 refills | Status: AC

## 2023-05-03 NOTE — Discharge Instructions (Signed)
Follow-up with one of the dermatology offices listed above.  Return emergency department if worsening.  If your syphilis test is positive please return emergency department for treatment.  Or he could also go to the health department.

## 2023-05-03 NOTE — ED Provider Notes (Signed)
[facility] Provider Note    Event Date/Time   First MD Initiated Contact with Patient 05/03/23 0957     (approximate)   History   No chief complaint on file.   HPI  Jeanette Zimmerman is a 27 y.o. female with history of asthma, ADHD, bipolar presents emergency department with a rash.  Rashes started around the back of the neck and on the hands.  Was seen at urgent care and given triamcinolone cream and Kenalog cream.  Patient's been using this without any relief.      Physical Exam   Triage Vital Signs: ED Triage Vitals  Encounter Vitals Group     BP 05/03/23 0941 (!) 150/92     Systolic BP Percentile --      Diastolic BP Percentile --      Pulse Rate 05/03/23 0941 88     Resp 05/03/23 0941 18     Temp 05/03/23 0941 98.2 F (36.8 C)     Temp Source 05/03/23 0941 Oral     SpO2 05/03/23 0941 100 %     Weight --      Height --      Head Circumference --      Peak Flow --      Pain Score 05/03/23 0947 0     Pain Loc --      Pain Education --      Exclude from Growth Chart --     Most recent vital signs: Vitals:   05/03/23 0941  BP: (!) 150/92  Pulse: 88  Resp: 18  Temp: 98.2 F (36.8 C)  SpO2: 100%     General: Awake, no distress.   CV:  Good peripheral perfusion. regular rate and  rhythm Resp:  Normal effort.  Abd:  No distention.   Other:  Skin with large areas in the scalp along the back of the neck, looks more like a allergic reaction to either shampoo or moisturizer, 1 area on the right hand is circular and does not appear to be like the other rash.   ED Results / Procedures / Treatments   Labs (all labs ordered are listed, but only abnormal results are displayed) Labs Reviewed  RPR     EKG     RADIOLOGY     PROCEDURES:   Procedures   MEDICATIONS ORDERED IN ED: Medications  dexamethasone (DECADRON) injection 10 mg (10 mg Intramuscular Given 05/03/23 1032)     IMPRESSION / MDM / ASSESSMENT AND  PLAN / ED COURSE  I reviewed the triage vital signs and the nursing notes.                              Differential diagnosis includes, but is not limited to, contact dermatitis, allergic reaction, psoriasis, severe eczema, syphilis  Patient's presentation is most consistent with acute illness / injury with system symptoms.   RPR ordered, did explain the patient results will not be available for at least 2 days.  I did place her on betamethasone along with a steroid pack, she was given Decadron 10 mg IM while here in the ED.  She is to stop the triamcinolone cream and the Kenalog cream.  Patient is in agreement treatment plan.  Strict instructions to follow-up with dermatology if not improving to 3 days.  Follow-up with the health department if syphilis testing is positive.  Discharged in stable condition.  In agreement treatment  plan.      FINAL CLINICAL IMPRESSION(S) / ED DIAGNOSES   Final diagnoses:  Allergic contact dermatitis due to cosmetics     Rx / DC Orders   ED Discharge Orders          Ordered    predniSONE (STERAPRED UNI-PAK 48 TAB) 10 MG (48) TBPK tablet        05/03/23 1027    betamethasone valerate ointment (VALISONE) 0.1 %  2 times daily        05/03/23 1027             Note:  This document was prepared using Dragon voice recognition software and may include unintentional dictation errors.    Faythe Ghee, PA-C 05/03/23 1605    Jene Every, MD 05/06/23 [phone]

## 2023-05-03 NOTE — ED Triage Notes (Addendum)
Pt states rash started 3 weeks ago and states it is spreading. NAD noted. Pt states HX of eczema.

## 2023-05-04 LAB — RPR: RPR Ser Ql: NONREACTIVE

## 2023-08-05 ENCOUNTER — Ambulatory Visit: Payer: Medicaid Other

## 2023-09-03 ENCOUNTER — Ambulatory Visit

## 2023-09-03 ENCOUNTER — Encounter: Payer: Self-pay | Admitting: Nurse Practitioner

## 2023-09-03 DIAGNOSIS — Z113 Encounter for screening for infections with a predominantly sexual mode of transmission: Secondary | ICD-10-CM

## 2023-09-03 LAB — HEPATITIS B SURFACE ANTIGEN

## 2023-09-03 LAB — HM HIV SCREENING LAB: HM HIV Screening: NEGATIVE

## 2023-09-03 LAB — HM HEPATITIS C SCREENING LAB: HM Hepatitis Screen: NEGATIVE

## 2023-09-03 NOTE — Progress Notes (Signed)
 Pt is here for STD screening , Wet prep results reviewed with pt, no treatment required per standing order. Condoms declined. Sonda Primes, RN.

## 2023-09-04 LAB — WET PREP FOR TRICH, YEAST, CLUE
Trichomonas Exam: NEGATIVE
Yeast Exam: NEGATIVE

## 2023-09-09 NOTE — Progress Notes (Signed)
 [facility] Department STI clinic [address], [address] [place] [address] Main phone: [phone]  STI screening visit  Subjective:  Jeanette Zimmerman is a 28 y.o. female being seen today for an STI screening visit. The patient reports they do not have symptoms.  Patient reports that they do not desire a pregnancy in the next year.   They reported they are not interested in discussing contraception today.    Patient's last menstrual period was 08/27/2023.  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 female who presents to the office today requesting asymptomatic STI testing. Patient's only report today is that she sometimes razor burn with itching in the genital area, but this is not present today.  Patient indicates 1 female partner in the last 2 months. She reports practicing vaginal and oral sex and does not use condoms. Patient indicates a history of gonorrhea in 2017. Patient reports last sex was 3-4 months ago. She indicates no use of contraception method as she has same sex partners.  Patient indicates LMP was 08/27/23 (7 days ago) and has periods monthly.   Does the patient using douching products? Not assessed.   Last HIV test per patient/review of record was  Lab  Results  Component Value Date   HMHIVSCREEN Negative - Validated 10/13/2022    Lab Results  Component Value Date   HIV Non Reactive 11/07/2016     Last HEPC test per patient/review of record was  Lab Results  Component Value Date   HMHEPCSCREEN Negative-Validated 10/13/2022    Last HEPB test per patient/review of record was No components found for: "HMHEPBSCREEN"   Patient reports last pap was:   No results found for: "DIAGPAP", "HPVHIGH", "ADEQPAP" No results found for: "SPECADGYN" No Cervical Cancer Screening results to display.  Screening for MPX risk: Does the patient have an unexplained rash? No Is the patient MSM? No Does the patient endorse multiple sex partners or anonymous sex partners? No Did the patient have close or sexual contact with a person diagnosed with MPX? No Has the patient traveled outside the [place] where MPX is endemic? No Is there a high clinical suspicion for MPX-- evidenced by one of the following No  -Unlikely to be chickenpox  -Lymphadenopathy  -Rash that present in same phase of evolution on any given body part See flowsheet for further details and programmatic requirements.   Immunization history:  Immunization History  Administered Date(s) Administered   Tdap 05/10/2018     The following portions of the patient's history were reviewed and updated as appropriate: allergies, current medications, past medical history, past social history, past surgical history and problem list.  Objective:  There were no vitals filed for this visit.  Physical Exam Nursing note reviewed.  Constitutional:      Appearance: Normal appearance.  HENT:  Head: Normocephalic.     Salivary Glands: Right salivary gland is not diffusely enlarged or tender. Left salivary gland is not diffusely enlarged or tender.     Mouth/Throat:     Lips: Pink. No lesions.     Mouth: Mucous membranes are moist.     Tongue: No lesions. Tongue does not deviate from midline.      Pharynx: Oropharynx is clear. Uvula midline. No oropharyngeal exudate or posterior oropharyngeal erythema.     Tonsils: No tonsillar exudate.  Eyes:     General:        Right eye: No discharge.        Left eye: No discharge.  Pulmonary:     Effort: Pulmonary effort is normal.  Genitourinary:    Comments: Patient asymptomatic. Declines genital exam. Self-swabbing.  Lymphadenopathy:     Head:     Right side of head: No submental, submandibular, tonsillar, preauricular or posterior auricular adenopathy.     Left side of head: No submental, submandibular, tonsillar, preauricular or posterior auricular adenopathy.     Cervical: No cervical adenopathy.     Right cervical: No superficial or posterior cervical adenopathy.    Left cervical: No superficial or posterior cervical adenopathy.     Upper Body:     Right upper body: No supraclavicular or axillary adenopathy.     Left upper body: No supraclavicular or axillary adenopathy.  Skin:    General: Skin is warm and dry.     Findings: No lesion or rash.     Comments: Skin tone appropriate for ethnicity. Assessed exposed areas only and back.   Neurological:     Mental Status: She is alert and oriented to person, place, and time.  Psychiatric:        Attention and Perception: Attention and perception normal.        Mood and Affect: Mood and affect normal.        Speech: Speech normal.        Behavior: Behavior normal. Behavior is cooperative.        Thought Content: Thought content normal.     Assessment and Plan:  Jeanette Zimmerman is a 28 y.o. female presenting to the [facility] Department for STI screening  1. Screening for venereal disease (Primary) Wet prep negative in office today.  - Chlamydia/Gonorrhea [facility] - HBV Antigen/Antibody [facility] - HIV/HCV [facility] - Syphilis Serology,  Lab - WET PREP FOR TRICH, YEAST, CLUE   Patient accepted some screenings including vaginal CT/GC and bloodwork  for HIV/RPR, and wet prep. Patient meets criteria for HepB screening? Yes. Ordered? yes Patient meets criteria for HepC screening? Yes. Ordered? yes  Treat wet prep per standing order Discussed time line for State Lab results and that patient will be called with positive results and encouraged patient to cal if she had not heard in 2 weeks.  Counseled to return or seek care for continued or worsening symptoms Recommended repeat testing in 3 months with positive results. Recommended condom use with all sex for STI prevention.   Patient is currently using  nothing  to prevent pregnancy (Same sex partner).    Return if symptoms worsen or fail to improve.  No future appointments.  Total time with patient 20 minutes.  Edmonia James, NP

## 2023-11-15 ENCOUNTER — Ambulatory Visit: Admitting: Cardiology

## 2023-12-28 ENCOUNTER — Other Ambulatory Visit: Payer: Self-pay

## 2023-12-28 DIAGNOSIS — Z716 Tobacco abuse counseling: Secondary | ICD-10-CM | POA: Insufficient documentation

## 2023-12-28 DIAGNOSIS — R0981 Nasal congestion: Secondary | ICD-10-CM | POA: Diagnosis not present

## 2023-12-28 DIAGNOSIS — J029 Acute pharyngitis, unspecified: Secondary | ICD-10-CM | POA: Diagnosis present

## 2023-12-28 DIAGNOSIS — H6123 Impacted cerumen, bilateral: Secondary | ICD-10-CM | POA: Diagnosis not present

## 2023-12-28 LAB — GROUP A STREP BY PCR: Group A Strep by PCR: NOT DETECTED

## 2023-12-28 NOTE — ED Triage Notes (Signed)
 Pt reports sore throat for the past few days and cough

## 2023-12-29 ENCOUNTER — Emergency Department
Admission: EM | Admit: 2023-12-29 | Discharge: 2023-12-29 | Disposition: A | Discharge status: Home / Self Care | Attending: Emergency Medicine | Admitting: Emergency Medicine

## 2023-12-29 ENCOUNTER — Emergency Department

## 2023-12-29 DIAGNOSIS — Z716 Tobacco abuse counseling: Secondary | ICD-10-CM

## 2023-12-29 DIAGNOSIS — R0981 Nasal congestion: Secondary | ICD-10-CM

## 2023-12-29 DIAGNOSIS — R051 Acute cough: Secondary | ICD-10-CM

## 2023-12-29 DIAGNOSIS — H6123 Impacted cerumen, bilateral: Secondary | ICD-10-CM

## 2023-12-29 DIAGNOSIS — J029 Acute pharyngitis, unspecified: Secondary | ICD-10-CM

## 2023-12-29 LAB — RESP PANEL BY RT-PCR (RSV, FLU A&B, COVID)  RVPGX2
Influenza A by PCR: NEGATIVE
Influenza B by PCR: NEGATIVE
Resp Syncytial Virus by PCR: NEGATIVE
SARS Coronavirus 2 by RT PCR: NEGATIVE

## 2023-12-29 MED ORDER — DEXAMETHASONE 6 MG PO TABS
10.0000 mg | ORAL_TABLET | Freq: Once | ORAL | Status: AC
  Administered 2023-12-29: 10 mg via ORAL
  Filled 2023-12-29: qty 1

## 2023-12-29 MED ORDER — NICOTINE 14 MG/24HR TD PT24: 14.0000 mg | MEDICATED_PATCH | Freq: Every day | TRANSDERMAL | 0 refills | Status: AC

## 2023-12-29 MED ORDER — NICOTINE POLACRILEX 4 MG MT LOZG
4.0000 mg | LOZENGE | OROMUCOSAL | 0 refills | Status: AC | PRN
Start: 2023-12-29 — End: ?

## 2023-12-29 MED ORDER — IBUPROFEN 600 MG PO TABS
600.0000 mg | ORAL_TABLET | Freq: Once | ORAL | Status: AC
  Administered 2023-12-29: 600 mg via ORAL
  Filled 2023-12-29: qty 1

## 2023-12-29 MED ORDER — ACETAMINOPHEN 500 MG PO TABS
1000.0000 mg | ORAL_TABLET | Freq: Once | ORAL | Status: AC
  Administered 2023-12-29: 1000 mg via ORAL
  Filled 2023-12-29: qty 2

## 2023-12-29 NOTE — ED Provider Notes (Signed)
 [facility] Provider Note    Event Date/Time   First MD Initiated Contact with Patient 12/29/23 0114     (approximate)   History   Sore Throat   HPI  Jeanette Zimmerman is a 28 y.o. female   Past medical history of cigarette smoker who presents to the Emergency Department with a couple of days of productive cough, nasal congestion, ear fullness, subjective fever and chills.  Denies GI or GU symptoms.  No known sick contacts or foreign travel.   External Medical Documents Reviewed: Outpatient notes from March of this year      Physical Exam   Triage Vital Signs: ED Triage Vitals  Encounter Vitals Group     BP 12/28/23 2305 (!) 160/107     Girls Systolic BP Percentile --      Girls Diastolic BP Percentile --      Boys Systolic BP Percentile --      Boys Diastolic BP Percentile --      Pulse Rate 12/28/23 2305 78     Resp 12/28/23 2305 18     Temp 12/28/23 2305 98 F (36.7 C)     Temp Source 12/29/23 0125 Oral     SpO2 12/28/23 2305 100 %     Weight 12/28/23 2304 185 lb (83.9 kg)     Height 12/28/23 2304 5' 5 (1.651 m)     Head Circumference --      Peak Flow --      Pain Score 12/28/23 2304 10     Pain Loc --      Pain Education --      Exclude from Growth Chart --     Most recent vital signs: Vitals:   12/28/23 2305 12/29/23 0125  BP: (!) 160/107 122/86  Pulse: 78 72  Resp: 18 15  Temp: 98 F (36.7 C) 98.4 F (36.9 C)  SpO2: 100% 100%    General: Awake, no distress.  CV:  Good peripheral perfusion.  Resp:  Normal effort.  Abd:  No distention.  Other:  Well-appearing nontoxic-appearing normal vital signs afebrile no hypoxemia and no respiratory distress.  Clear lungs to auscultation without focality or wheezing.  Bilateral cerumen impaction reexamined after irrigation without evidence of acute otitis media normal-appearing eardrums.  Posterior oropharyngeal exam shows no obvious abscess or uvular deviation, some erythema  but no exudate.   ED Results / Procedures / Treatments   Labs (all labs ordered are listed, but only abnormal results are displayed) Labs Reviewed  RESP PANEL BY RT-PCR (RSV, FLU A&B, COVID)  [id number]  GROUP A STREP BY PCR     I ordered and reviewed the above labs they are notable for negative for group A strep negative for COVID influenza and RSV    RADIOLOGY I independently reviewed and interpreted chest x-ray I see no obvious focality pneumothorax I also reviewed radiologist's formal read.   PROCEDURES:  Critical Care performed: No  Procedures   MEDICATIONS ORDERED IN ED: Medications  dexamethasone  (DECADRON ) tablet 10 mg (10 mg Oral Given 12/29/23 0227)  ibuprofen  (ADVIL ) tablet 600 mg (600 mg Oral Given 12/29/23 0156)  acetaminophen  (TYLENOL ) tablet 1,000 mg (1,000 mg Oral Given 12/29/23 0156)     IMPRESSION / MDM / ASSESSMENT AND PLAN / ED COURSE  I reviewed the triage vital signs and the nursing notes.  Patient's presentation is most consistent with acute presentation with potential threat to life or bodily function.  Differential diagnosis includes, but is not limited to, viral URI, strep pharyngitis, peritonsillar abscess tonsillitis deep space neck infection bacterial pneumonia considered but less likely sepsis or meningitis   The patient is on the cardiac monitor to evaluate for evidence of arrhythmia and/or significant heart rate changes.  MDM:    Symptoms of viral URI with no evidence of bacterial infection on ear exam or lung exam or chest x-ray.  Does not appear septic.  Given Tylenol  ibuprofen  and dexamethasone  for symptomatic improvement.  No evidence of deep space neck infection or abscess requiring drainage and no airway obstruction at this time fortunately.  Cerumen impaction removed and feels that the ear fullness is better already.  Given her unremarkable workup and stability in the emergency department  anticipate she can be managed as outpatient.  Dispo guidance given, PMD follow-up, return with new or worsening symptoms   -- I spent 5 minutes counseling this patient on smoking cessation.  We spoke about the patient's current tobacco use, impact of smoking, assessed willingness to quit, methods for cessation including medical management and nicotine  replacement therapy (which I prescribed to the patient) and advised follow-up with primary doctor to continue to address smoking cessation.       FINAL CLINICAL IMPRESSION(S) / ED DIAGNOSES   Final diagnoses:  Pharyngitis, unspecified etiology  Nasal congestion  Acute cough  Encounter for smoking cessation counseling  Bilateral impacted cerumen     Rx / DC Orders   ED Discharge Orders          Ordered    nicotine  polacrilex (NICOTINE  MINI) 4 MG lozenge  As needed        12/29/23 0146    nicotine  (NICODERM CQ  - DOSED IN MG/24 HOURS) 14 mg/24hr patch  Daily        12/29/23 0146             Note:  This document was prepared using Dragon voice recognition software and may include unintentional dictation errors.    Cyrena Mylar, MD 12/29/23 [phone]

## 2023-12-29 NOTE — Discharge Instructions (Signed)
 Take acetaminophen  650 mg and ibuprofen  400 mg every 6 hours for pain.  Take with food. Find Debrox at your local pharmacy to help prevent earwax buildup in the future.  Thank you for choosing us  for your health care today!  Please see your primary doctor this week for a follow up appointment.   If you have any new, worsening, or unexpected symptoms call your doctor right away or come back to the emergency department for reevaluation.  It was my pleasure to care for you today.   Ginnie EDISON Cyrena, MD

## 2024-03-10 ENCOUNTER — Ambulatory Visit: Payer: MEDICAID

## 2024-07-05 ENCOUNTER — Emergency Department
Admission: EM | Admit: 2024-07-05 | Discharge: 2024-07-05 | Disposition: A | Discharge status: Home / Self Care | Payer: MEDICAID | Source: Home / Self Care | Attending: Emergency Medicine | Admitting: Emergency Medicine

## 2024-07-05 ENCOUNTER — Other Ambulatory Visit: Payer: Self-pay

## 2024-07-05 DIAGNOSIS — N611 Abscess of the breast and nipple: Secondary | ICD-10-CM | POA: Diagnosis present

## 2024-07-05 DIAGNOSIS — N61 Mastitis without abscess: Secondary | ICD-10-CM | POA: Insufficient documentation

## 2024-07-05 DIAGNOSIS — L03818 Cellulitis of other sites: Secondary | ICD-10-CM

## 2024-07-05 DIAGNOSIS — L0291 Cutaneous abscess, unspecified: Secondary | ICD-10-CM

## 2024-07-05 MED ORDER — DOXYCYCLINE HYCLATE 100 MG PO TABS: 100.0000 mg | ORAL_TABLET | Freq: Two times a day (BID) | ORAL | 0 refills | Status: AC

## 2024-07-05 MED ORDER — CEPHALEXIN 500 MG PO CAPS: 500.0000 mg | ORAL_CAPSULE | Freq: Two times a day (BID) | ORAL | 0 refills | Status: AC

## 2024-07-05 MED ORDER — DOXYCYCLINE HYCLATE 100 MG PO TABS
100.0000 mg | ORAL_TABLET | Freq: Once | ORAL | Status: AC
  Administered 2024-07-05: 100 mg via ORAL
  Filled 2024-07-05: qty 1

## 2024-07-05 MED ORDER — KETOROLAC TROMETHAMINE 15 MG/ML IJ SOLN
15.0000 mg | Freq: Once | INTRAMUSCULAR | Status: AC
  Administered 2024-07-05: 15 mg via INTRAMUSCULAR
  Filled 2024-07-05: qty 1

## 2024-07-05 MED ORDER — CEPHALEXIN 500 MG PO CAPS
500.0000 mg | ORAL_CAPSULE | Freq: Once | ORAL | Status: AC
  Administered 2024-07-05: 500 mg via ORAL
  Filled 2024-07-05: qty 1

## 2024-07-05 MED ORDER — CEPHALEXIN 500 MG PO CAPS: 500.0000 mg | ORAL_CAPSULE | Freq: Two times a day (BID) | ORAL | 0 refills | Status: DC

## 2024-07-05 MED ORDER — ACETAMINOPHEN 500 MG PO TABS
1000.0000 mg | ORAL_TABLET | Freq: Once | ORAL | Status: AC
  Administered 2024-07-05: 1000 mg via ORAL
  Filled 2024-07-05: qty 2

## 2024-07-05 MED ORDER — DOXYCYCLINE HYCLATE 100 MG PO TABS: 100.0000 mg | ORAL_TABLET | Freq: Two times a day (BID) | ORAL | 0 refills | Status: DC

## 2024-07-05 NOTE — ED Triage Notes (Signed)
 Pt reports abscess to left breast that began last night, pt reports pain to area. Denies drainage.

## 2024-07-05 NOTE — Discharge Instructions (Signed)
 Please take the antibiotics as prescribed for your abscess and skin infection.  I have also given you a number to call for surgery to schedule follow-up for further management and reassessment of your infection site.

## 2024-07-05 NOTE — ED Provider Notes (Signed)
 [facility] Provider Note    Event Date/Time   First MD Initiated Contact with Patient 07/05/24 [phone]     (approximate)   History   Abscess   HPI  Jeanette Zimmerman is a 29 y.o. female history of ADHD, bipolar disorder, history of abscess, presenting with abscess to her right anterior breast.  States that it started overnight.  No fever.  States that this has occurred in the past.  Denies any drainage at this time, just says that it is slightly red.     Physical Exam   Triage Vital Signs: ED Triage Vitals  Encounter Vitals Group     BP 07/05/24 0354 (!) 170/113     Girls Systolic BP Percentile --      Girls Diastolic BP Percentile --      Boys Systolic BP Percentile --      Boys Diastolic BP Percentile --      Pulse Rate 07/05/24 0354 79     Resp 07/05/24 0354 18     Temp 07/05/24 0354 99 F (37.2 C)     Temp src --      SpO2 07/05/24 0354 98 %     Weight 07/05/24 0353 180 lb (81.6 kg)     Height 07/05/24 0353 5' 5 (1.651 m)     Head Circumference --      Peak Flow --      Pain Score 07/05/24 0353 10     Pain Loc --      Pain Education --      Exclude from Growth Chart --     Most recent vital signs: Vitals:   07/05/24 0354  BP: (!) 170/113  Pulse: 79  Resp: 18  Temp: 99 F (37.2 C)  SpO2: 98%     General: Awake, no distress.  CV:  Good peripheral perfusion.  Resp:  Normal effort.  No tachypnea or respiratory distress Abd:  No distention.  Other:  Nontoxic appearing, she has a 3 cm area of erythema and induration to her right upper anterior breast.  There is a smaller than 0.5 cm area of fluctuance without any drainage or open wounds.   ED Results / Procedures / Treatments   Labs (all labs ordered are listed, but only abnormal results are displayed) Labs Reviewed - No data to display    PROCEDURES:  Critical Care performed: No  Procedures   MEDICATIONS ORDERED IN ED: Medications  acetaminophen  (TYLENOL )  tablet 1,000 mg (has no administration in time range)  ketorolac  (TORADOL ) 15 MG/ML injection 15 mg (has no administration in time range)  doxycycline  (VIBRA -TABS) tablet 100 mg (has no administration in time range)  cephALEXin  (KEFLEX ) capsule 500 mg (has no administration in time range)     IMPRESSION / MDM / ASSESSMENT AND PLAN / ED COURSE  I reviewed the triage vital signs and the nursing notes.                              Differential diagnosis includes, but is not limited to, small abscess, cellulitis.  No pain on proportion to suggest deeper space infection.  Suspect this abscess is still quite small given the air fluctuance is very small.  Will start her on antibiotics.  Will give her a number to call for surgery to follow-up.  Will give her some Tylenol  and Toradol  here.  Considered but no indication for inpatient admission  at this time, she is safe for outpatient management.  Will discharge after initial dose of antibiotics.  Strict return precautions given.  Discharge.  Patient's presentation is most consistent with acute presentation with potential threat to life or bodily function.         FINAL CLINICAL IMPRESSION(S) / ED DIAGNOSES   Final diagnoses:  Abscess  Cellulitis of other specified site     Rx / DC Orders   ED Discharge Orders          Ordered    doxycycline  (VIBRA -TABS) 100 MG tablet  2 times daily        07/05/24 0456    cephALEXin  (KEFLEX ) 500 MG capsule  2 times daily        07/05/24 0456             Note:  This document was prepared using Dragon voice recognition software and may include unintentional dictation errors.    Waymond Lorelle Cummins, MD 07/05/24 [phone]
